# Patient Record
Sex: Female | Born: 1960 | Race: White | Hispanic: No | Marital: Married | Smoking: Current every day smoker
Health system: Southern US, Community
[De-identification: ages and names within clinical notes are randomized; demographics above are authoritative.]

## PROBLEM LIST (undated history)

## (undated) DIAGNOSIS — M1990 Unspecified osteoarthritis, unspecified site: Secondary | ICD-10-CM

## (undated) DIAGNOSIS — F329 Major depressive disorder, single episode, unspecified: Secondary | ICD-10-CM

## (undated) DIAGNOSIS — E039 Hypothyroidism, unspecified: Secondary | ICD-10-CM

## (undated) DIAGNOSIS — F419 Anxiety disorder, unspecified: Secondary | ICD-10-CM

## (undated) DIAGNOSIS — H8109 Meniere's disease, unspecified ear: Secondary | ICD-10-CM

## (undated) DIAGNOSIS — R51 Headache: Secondary | ICD-10-CM

## (undated) DIAGNOSIS — F32A Depression, unspecified: Secondary | ICD-10-CM

## (undated) DIAGNOSIS — R519 Headache, unspecified: Secondary | ICD-10-CM

## (undated) DIAGNOSIS — J449 Chronic obstructive pulmonary disease, unspecified: Secondary | ICD-10-CM

## (undated) HISTORY — PX: ABDOMINAL HYSTERECTOMY: SHX81

## (undated) HISTORY — PX: SHOULDER ARTHROSCOPY: SHX128

## (undated) HISTORY — PX: FOOT SURGERY: SHX648

---

## 2009-03-27 HISTORY — PX: SHOULDER SURGERY: SHX246

## 2016-09-13 ENCOUNTER — Other Ambulatory Visit: Payer: Self-pay | Admitting: Neurological Surgery

## 2016-09-19 ENCOUNTER — Encounter (HOSPITAL_COMMUNITY)
Admission: RE | Admit: 2016-09-19 | Discharge: 2016-09-19 | Disposition: A | Discharge status: Home / Self Care | Payer: Worker's Compensation | Source: Ambulatory Visit | Attending: Neurological Surgery | Admitting: Neurological Surgery

## 2016-09-19 ENCOUNTER — Other Ambulatory Visit (HOSPITAL_COMMUNITY): Payer: Self-pay

## 2016-09-19 ENCOUNTER — Ambulatory Visit (HOSPITAL_COMMUNITY)
Admission: RE | Admit: 2016-09-19 | Discharge: 2016-09-19 | Disposition: A | Discharge status: Home / Self Care | Payer: Worker's Compensation | Source: Ambulatory Visit | Attending: Neurological Surgery | Admitting: Neurological Surgery

## 2016-09-19 ENCOUNTER — Encounter (HOSPITAL_COMMUNITY): Payer: Self-pay

## 2016-09-19 ENCOUNTER — Inpatient Hospital Stay (HOSPITAL_COMMUNITY): Admission: RE | Admit: 2016-09-19 | Payer: Self-pay | Source: Ambulatory Visit

## 2016-09-19 DIAGNOSIS — M4802 Spinal stenosis, cervical region: Secondary | ICD-10-CM | POA: Diagnosis not present

## 2016-09-19 DIAGNOSIS — Z01810 Encounter for preprocedural cardiovascular examination: Secondary | ICD-10-CM | POA: Diagnosis not present

## 2016-09-19 DIAGNOSIS — Z01812 Encounter for preprocedural laboratory examination: Secondary | ICD-10-CM | POA: Insufficient documentation

## 2016-09-19 HISTORY — DX: Headache, unspecified: R51.9

## 2016-09-19 HISTORY — DX: Unspecified osteoarthritis, unspecified site: M19.90

## 2016-09-19 HISTORY — DX: Chronic obstructive pulmonary disease, unspecified: J44.9

## 2016-09-19 HISTORY — DX: Meniere's disease, unspecified ear: H81.09

## 2016-09-19 HISTORY — DX: Depression, unspecified: F32.A

## 2016-09-19 HISTORY — DX: Anxiety disorder, unspecified: F41.9

## 2016-09-19 HISTORY — DX: Hypothyroidism, unspecified: E03.9

## 2016-09-19 HISTORY — DX: Headache: R51

## 2016-09-19 HISTORY — DX: Major depressive disorder, single episode, unspecified: F32.9

## 2016-09-19 LAB — CBC WITH DIFFERENTIAL/PLATELET
Basophils Absolute: 0 10*3/uL (ref 0.0–0.1)
Basophils Relative: 0 %
EOS ABS: 0.2 10*3/uL (ref 0.0–0.7)
EOS PCT: 2 %
HCT: 44.8 % (ref 36.0–46.0)
Hemoglobin: 14.9 g/dL (ref 12.0–15.0)
LYMPHS PCT: 37 %
Lymphs Abs: 2.9 10*3/uL (ref 0.7–4.0)
MCH: 30 pg (ref 26.0–34.0)
MCHC: 33.3 g/dL (ref 30.0–36.0)
MCV: 90.3 fL (ref 78.0–100.0)
MONO ABS: 0.4 10*3/uL (ref 0.1–1.0)
Monocytes Relative: 5 %
Neutro Abs: 4.3 10*3/uL (ref 1.7–7.7)
Neutrophils Relative %: 56 %
PLATELETS: 261 10*3/uL (ref 150–400)
RBC: 4.96 MIL/uL (ref 3.87–5.11)
RDW: 13.8 % (ref 11.5–15.5)
WBC: 7.8 10*3/uL (ref 4.0–10.5)

## 2016-09-19 LAB — BASIC METABOLIC PANEL
Anion gap: 9 (ref 5–15)
CALCIUM: 9.5 mg/dL (ref 8.9–10.3)
CO2: 31 mmol/L (ref 22–32)
CREATININE: 0.9 mg/dL (ref 0.44–1.00)
Chloride: 99 mmol/L — ABNORMAL LOW (ref 101–111)
GFR calc Af Amer: >60 mL/min (ref >60)
GLUCOSE: 101 mg/dL — AB (ref 65–99)
POTASSIUM: 3.4 mmol/L — AB (ref 3.5–5.1)
SODIUM: 139 mmol/L (ref 135–145)

## 2016-09-19 LAB — SURGICAL PCR SCREEN
MRSA, PCR: NEGATIVE
Staphylococcus aureus: NEGATIVE

## 2016-09-19 LAB — PROTIME-INR
INR: 0.95
PROTHROMBIN TIME: 12.6 s (ref 11.4–15.2)

## 2016-09-19 MED ORDER — CHLORHEXIDINE GLUCONATE CLOTH 2 % EX PADS: 6.0000 | MEDICATED_PAD | Freq: Once | CUTANEOUS | Status: DC

## 2016-09-19 NOTE — Pre-Procedure Instructions (Signed)
Theresa SpurlingCheryl Michael [place]  09/19/2016      [facility] [address] - [place], [place] - [address] [place] [id number] Phone: [phone] Fax: [phone]   Your procedure is scheduled on September 22, 2016. [facility] at 1100 A.M. Call this number if you have problems the morning of [phone]   Remember:  Do not eat food or drink liquids after midnight September 21, 2016.  Take these medicines the morning of surgery with A SIP OF WATER acetominophen-codeine (tylenol #3) if needed, cyclobenzaprine (flexeril)-if needed, diazepam (valium)-if needed, duloxetine (cymbalta), fluticasone (flonase), gabapentin (neurontin), levothyroxine (synthroid), loratadine (claritin).  7 days prior to surgery STOP taking any Aspirin, Aleve, Naproxen, Ibuprofen, Motrin, Advil, Goody's, BC's, all herbal medications, fish oil, and all vitamins   Do not wear jewelry, make-up or nail polish.  Do not wear lotions, powders, or perfumes, or deoderant.  Do not shave 48 hours prior to surgery.    Do not bring valuables to the hospital.  [facility] is not responsible for any belongings or valuables.  Contacts, dentures or bridgework may not be worn into surgery.  Leave your suitcase in the car.  After surgery it may be brought to your room.  For patients admitted to the hospital, discharge time will be determined by your treatment team.  Patients discharged the day of surgery will not be allowed to drive home.    Special instructions:   [place]- Preparing For Surgery  Before surgery, you can play an important role. Because skin is not sterile, your skin needs to be as free of germs as possible. You can reduce the number of germs on your skin by washing with CHG (chlorahexidine gluconate) Soap before surgery.  CHG is an antiseptic cleaner which kills germs and bonds with the skin to continue killing germs even after  washing.  Please do not use if you have an allergy to CHG or antibacterial soaps. If your skin becomes reddened/irritated stop using the CHG.  Do not shave (including legs and underarms) for at least 48 hours prior to first CHG shower. It is OK to shave your face.  Please follow these instructions carefully.   1. Shower the NIGHT BEFORE SURGERY and the MORNING OF SURGERY with CHG.   2. If you chose to wash your hair, wash your hair first as usual with your normal shampoo.  3. After you shampoo, rinse your hair and body thoroughly to remove the shampoo.  4. Use CHG as you would any other liquid soap. You can apply CHG directly to the skin and wash gently with a scrungie or a clean washcloth.   5. Apply the CHG Soap to your body ONLY FROM THE NECK DOWN.  Do not use on open wounds or open sores. Avoid contact with your eyes, ears, mouth and genitals (private parts). Wash genitals (private parts) with your normal soap.  6. Wash thoroughly, paying special attention to the area where your surgery will be performed.  7. Thoroughly rinse your body with warm water from the neck down.  8. DO NOT shower/wash with your normal soap after using and rinsing off the CHG Soap.  9. Pat yourself dry with a CLEAN TOWEL.   10. Wear CLEAN PAJAMAS   11. Place CLEAN SHEETS on your bed the night of your first shower and DO NOT SLEEP WITH PETS.    Day of Surgery: Do not apply any  deodorants/lotions. Please wear clean clothes to the hospital/surgery center.     Please read over the following fact sheets that you were given. Pain Booklet, Coughing and Deep Breathing, MRSA Information and Surgical Site Infection Prevention

## 2016-09-19 NOTE — Progress Notes (Addendum)
ZOX:[id number]:Patient, No Pcp Per  Cardiologist: pt denies  EKG: pt denies past year  Stress test: pt denies  ECHO: pt denies  Cardiac Cath: pt denies  Chest x-ray:pt denies past year

## 2016-09-20 ENCOUNTER — Encounter (HOSPITAL_COMMUNITY): Payer: Self-pay | Admitting: Emergency Medicine

## 2016-09-22 ENCOUNTER — Encounter (HOSPITAL_COMMUNITY): Admission: RE | Payer: Self-pay | Source: Ambulatory Visit

## 2016-09-22 ENCOUNTER — Inpatient Hospital Stay (HOSPITAL_COMMUNITY)
Admission: RE | Admit: 2016-09-22 | Payer: Managed Care, Other (non HMO) | Source: Ambulatory Visit | Admitting: Neurological Surgery

## 2016-09-22 SURGERY — ANTERIOR CERVICAL DECOMPRESSION/DISCECTOMY FUSION 4 LEVELS
Anesthesia: General

## 2016-10-25 ENCOUNTER — Other Ambulatory Visit: Payer: Self-pay | Admitting: Neurological Surgery

## 2016-11-20 ENCOUNTER — Other Ambulatory Visit: Payer: Self-pay | Admitting: Neurological Surgery

## 2016-11-21 NOTE — Pre-Procedure Instructions (Signed)
Theresa [place] [facility]  11/21/2016      [facility] - [place] - [address] [place] [address] Phone: [phone] Fax: [phone]  [facility] - [address] [place], [place] - [address] AT [facility] [address] [place] [address] Phone: [phone] Fax: [phone]    Your procedure is scheduled on  Thursday 11/30/16  Report to [facility] Admitting at 530 A.M.  Call this number if you have problems the morning of surgery:  [phone]   Remember:  Do not eat food or drink liquids after midnight.  Take these medicines the morning of surgery with A SIP OF WATER   DIAZEPAM (VALIUM), DULOXETINE (CYMBALTA), GABAPENTIN, LEVOTHYROXINE, POTASSIUM  7 days prior to surgery STOP taking any Aspirin, Aleve, Naproxen, Ibuprofen, Motrin, Advil, Goody's, BC's, all herbal medications, fish oil, and all vitamins, GARLIC   Do not wear jewelry, make-up or nail polish.  Do not wear lotions, powders, or perfumes, or deoderant.  Do not shave 48 hours prior to surgery.  Men may shave face and neck.  Do not bring valuables to the hospital.  [facility] is not responsible for any belongings or valuables.  Contacts, dentures or bridgework may not be worn into surgery.  Leave your suitcase in the car.  After surgery it may be brought to your room.  For patients admitted to the hospital, discharge time will be determined by your treatment team.  Patients discharged the day of surgery will not be allowed to drive home.   Name and phone number of your driver:    Special instructions:  [place] - Preparing for Surgery  Before surgery, you can play an important role.  Because skin is not sterile, your skin needs to be as free of germs as possible.  You can reduce the number of germs on you skin by washing with CHG (chlorahexidine gluconate) soap  before surgery.  CHG is an antiseptic cleaner which kills germs and bonds with the skin to continue killing germs even after washing.  Please DO NOT use if you have an allergy to CHG or antibacterial soaps.  If your skin becomes reddened/irritated stop using the CHG and inform your nurse when you arrive at Short Stay.  Do not shave (including legs and underarms) for at least 48 hours prior to the first CHG shower.  You may shave your face.  Please follow these instructions carefully:   1.  Shower with CHG Soap the night before surgery and the                                morning of Surgery.  2.  If you choose to wash your hair, wash your hair first as usual with your       normal shampoo.  3.  After you shampoo, rinse your hair and body thoroughly to remove the                      Shampoo.  4.  Use CHG as you would any other liquid soap.  You can apply chg directly       to the skin and wash gently with scrungie or a clean washcloth.  5.  Apply the CHG Soap to your body ONLY FROM THE NECK  DOWN.        Do not use on open wounds or open sores.  Avoid contact with your eyes,       ears, mouth and genitals (private parts).  Wash genitals (private parts)       with your normal soap.  6.  Wash thoroughly, paying special attention to the area where your surgery        will be performed.  7.  Thoroughly rinse your body with warm water from the neck down.  8.  DO NOT shower/wash with your normal soap after using and rinsing off       the CHG Soap.  9.  Pat yourself dry with a clean towel.            10.  Wear clean pajamas.            11.  Place clean sheets on your bed the night of your first shower and do not        sleep with pets.  Day of Surgery  Do not apply any lotions/deoderants the morning of surgery.  Please wear clean clothes to the hospital/surgery center.    Please read over the following fact sheets that you were given. Pain Booklet, MRSA Information and Surgical Site Infection  Prevention

## 2016-11-22 ENCOUNTER — Encounter (HOSPITAL_COMMUNITY)
Admission: RE | Admit: 2016-11-22 | Discharge: 2016-11-22 | Disposition: A | Discharge status: Home / Self Care | Payer: Worker's Compensation | Source: Ambulatory Visit | Attending: Neurological Surgery | Admitting: Neurological Surgery

## 2016-11-22 ENCOUNTER — Encounter (HOSPITAL_COMMUNITY): Payer: Self-pay

## 2016-11-22 DIAGNOSIS — Z01812 Encounter for preprocedural laboratory examination: Secondary | ICD-10-CM | POA: Insufficient documentation

## 2016-11-22 LAB — CBC WITH DIFFERENTIAL/PLATELET
Basophils Absolute: 0 10*3/uL (ref 0.0–0.1)
Basophils Relative: 0 %
EOS PCT: 3 %
Eosinophils Absolute: 0.2 10*3/uL (ref 0.0–0.7)
HEMATOCRIT: 44.4 % (ref 36.0–46.0)
Hemoglobin: 15.1 g/dL — ABNORMAL HIGH (ref 12.0–15.0)
LYMPHS ABS: 2.2 10*3/uL (ref 0.7–4.0)
Lymphocytes Relative: 31 %
MCH: 30.4 pg (ref 26.0–34.0)
MCHC: 34 g/dL (ref 30.0–36.0)
MCV: 89.5 fL (ref 78.0–100.0)
MONO ABS: 0.5 10*3/uL (ref 0.1–1.0)
Monocytes Relative: 6 %
Neutro Abs: 4.3 10*3/uL (ref 1.7–7.7)
Neutrophils Relative %: 60 %
PLATELETS: 247 10*3/uL (ref 150–400)
RBC: 4.96 MIL/uL (ref 3.87–5.11)
RDW: 13.4 % (ref 11.5–15.5)
WBC: 7.2 10*3/uL (ref 4.0–10.5)

## 2016-11-22 LAB — BASIC METABOLIC PANEL
Anion gap: 8 (ref 5–15)
BUN: 7 mg/dL (ref 6–20)
CHLORIDE: 100 mmol/L — AB (ref 101–111)
CO2: 32 mmol/L (ref 22–32)
Calcium: 9.4 mg/dL (ref 8.9–10.3)
Creatinine, Ser: 0.91 mg/dL (ref 0.44–1.00)
GFR calc Af Amer: >60 mL/min (ref >60)
GFR calc non Af Amer: >60 mL/min (ref >60)
GLUCOSE: 86 mg/dL (ref 65–99)
Potassium: 3.8 mmol/L (ref 3.5–5.1)
Sodium: 140 mmol/L (ref 135–145)

## 2016-11-22 LAB — SURGICAL PCR SCREEN
MRSA, PCR: NEGATIVE
STAPHYLOCOCCUS AUREUS: NEGATIVE

## 2016-11-22 LAB — PROTIME-INR
INR: 0.86
Prothrombin Time: 11.6 seconds (ref 11.4–15.2)

## 2016-11-29 MED ORDER — CEFAZOLIN SODIUM-DEXTROSE 2-4 GM/100ML-% IV SOLN
2.0000 g | INTRAVENOUS | Status: AC
  Administered 2016-11-30: 2 g via INTRAVENOUS
  Filled 2016-11-29: qty 100

## 2016-11-29 MED ORDER — DEXAMETHASONE SODIUM PHOSPHATE 10 MG/ML IJ SOLN
10.0000 mg | INTRAMUSCULAR | Status: AC
  Administered 2016-11-30: 10 mg via INTRAVENOUS
  Filled 2016-11-29: qty 1

## 2016-11-30 ENCOUNTER — Inpatient Hospital Stay (HOSPITAL_COMMUNITY): Payer: Worker's Compensation | Admitting: Certified Registered Nurse Anesthetist

## 2016-11-30 ENCOUNTER — Inpatient Hospital Stay (HOSPITAL_COMMUNITY)
Admission: RE | Disposition: A | Discharge status: Home / Self Care | Payer: Self-pay | Source: Ambulatory Visit | Attending: Neurological Surgery

## 2016-11-30 ENCOUNTER — Inpatient Hospital Stay (HOSPITAL_COMMUNITY)
Admission: RE | Admit: 2016-11-30 | Discharge: 2016-12-02 | DRG: 473 | Disposition: A | Discharge status: Home / Self Care | Payer: Worker's Compensation | Source: Ambulatory Visit | Attending: Neurological Surgery | Admitting: Neurological Surgery

## 2016-11-30 ENCOUNTER — Inpatient Hospital Stay (HOSPITAL_COMMUNITY): Payer: Worker's Compensation

## 2016-11-30 ENCOUNTER — Encounter (HOSPITAL_COMMUNITY): Payer: Self-pay

## 2016-11-30 DIAGNOSIS — Z419 Encounter for procedure for purposes other than remedying health state, unspecified: Secondary | ICD-10-CM

## 2016-11-30 DIAGNOSIS — M4802 Spinal stenosis, cervical region: Secondary | ICD-10-CM | POA: Diagnosis present

## 2016-11-30 DIAGNOSIS — E039 Hypothyroidism, unspecified: Secondary | ICD-10-CM | POA: Diagnosis present

## 2016-11-30 DIAGNOSIS — M5021 Other cervical disc displacement,  high cervical region: Principal | ICD-10-CM | POA: Diagnosis present

## 2016-11-30 DIAGNOSIS — M1990 Unspecified osteoarthritis, unspecified site: Secondary | ICD-10-CM | POA: Diagnosis present

## 2016-11-30 DIAGNOSIS — Z881 Allergy status to other antibiotic agents status: Secondary | ICD-10-CM

## 2016-11-30 DIAGNOSIS — Z7951 Long term (current) use of inhaled steroids: Secondary | ICD-10-CM

## 2016-11-30 DIAGNOSIS — M4322 Fusion of spine, cervical region: Secondary | ICD-10-CM | POA: Diagnosis present

## 2016-11-30 DIAGNOSIS — F17200 Nicotine dependence, unspecified, uncomplicated: Secondary | ICD-10-CM | POA: Diagnosis present

## 2016-11-30 DIAGNOSIS — H8109 Meniere's disease, unspecified ear: Secondary | ICD-10-CM | POA: Diagnosis present

## 2016-11-30 DIAGNOSIS — J449 Chronic obstructive pulmonary disease, unspecified: Secondary | ICD-10-CM | POA: Diagnosis present

## 2016-11-30 DIAGNOSIS — Z888 Allergy status to other drugs, medicaments and biological substances status: Secondary | ICD-10-CM

## 2016-11-30 DIAGNOSIS — Z886 Allergy status to analgesic agent status: Secondary | ICD-10-CM

## 2016-11-30 DIAGNOSIS — Z885 Allergy status to narcotic agent status: Secondary | ICD-10-CM

## 2016-11-30 HISTORY — PX: ANTERIOR CERVICAL DECOMP/DISCECTOMY FUSION: SHX1161

## 2016-11-30 SURGERY — ANTERIOR CERVICAL DECOMPRESSION/DISCECTOMY FUSION 3 LEVELS
Anesthesia: General

## 2016-11-30 MED ORDER — NICOTINE 21 MG/24HR TD PT24
21.0000 mg | MEDICATED_PATCH | Freq: Every day | TRANSDERMAL | Status: DC
  Administered 2016-11-30 – 2016-12-01 (×2): 21 mg via TRANSDERMAL
  Filled 2016-11-30 (×2): qty 1

## 2016-11-30 MED ORDER — DULOXETINE HCL 30 MG PO CPEP
60.0000 mg | ORAL_CAPSULE | Freq: Two times a day (BID) | ORAL | Status: DC
  Administered 2016-11-30 – 2016-12-01 (×3): 60 mg via ORAL
  Filled 2016-11-30 (×5): qty 2

## 2016-11-30 MED ORDER — MENTHOL 3 MG MT LOZG: 1.0000 | LOZENGE | OROMUCOSAL | Status: DC | PRN

## 2016-11-30 MED ORDER — SODIUM CHLORIDE 0.9 % IR SOLN
Status: DC | PRN
  Administered 2016-11-30: 08:00:00

## 2016-11-30 MED ORDER — SUGAMMADEX SODIUM 200 MG/2ML IV SOLN
INTRAVENOUS | Status: DC | PRN
  Administered 2016-11-30: 150 mg via INTRAVENOUS

## 2016-11-30 MED ORDER — BUPIVACAINE HCL (PF) 0.5 % IJ SOLN
INTRAMUSCULAR | Status: AC
Start: 2016-11-30 — End: 2016-11-30
  Filled 2016-11-30: qty 30

## 2016-11-30 MED ORDER — LACTATED RINGERS IV SOLN
INTRAVENOUS | Status: DC | PRN
  Administered 2016-11-30 (×2): via INTRAVENOUS

## 2016-11-30 MED ORDER — HYDROMORPHONE HCL 1 MG/ML IJ SOLN: 0.2500 mg | INTRAMUSCULAR | Status: DC | PRN

## 2016-11-30 MED ORDER — GABAPENTIN 100 MG PO CAPS
100.0000 mg | ORAL_CAPSULE | Freq: Two times a day (BID) | ORAL | Status: DC
  Administered 2016-11-30 – 2016-12-01 (×3): 100 mg via ORAL
  Filled 2016-11-30 (×4): qty 1

## 2016-11-30 MED ORDER — 0.9 % SODIUM CHLORIDE (POUR BTL) OPTIME
TOPICAL | Status: DC | PRN
  Administered 2016-11-30: 1000 mL

## 2016-11-30 MED ORDER — ROCURONIUM BROMIDE 10 MG/ML (PF) SYRINGE
PREFILLED_SYRINGE | INTRAVENOUS | Status: AC
  Filled 2016-11-30: qty 5

## 2016-11-30 MED ORDER — VERAPAMIL HCL ER 240 MG PO TBCR
240.0000 mg | EXTENDED_RELEASE_TABLET | Freq: Every day | ORAL | Status: DC
  Administered 2016-12-01: 240 mg via ORAL
  Filled 2016-11-30 (×2): qty 1

## 2016-11-30 MED ORDER — CHLORHEXIDINE GLUCONATE CLOTH 2 % EX PADS: 6.0000 | MEDICATED_PAD | Freq: Once | CUTANEOUS | Status: DC

## 2016-11-30 MED ORDER — MIDAZOLAM HCL 2 MG/2ML IJ SOLN
INTRAMUSCULAR | Status: AC
  Filled 2016-11-30: qty 2

## 2016-11-30 MED ORDER — PHENYLEPHRINE HCL 10 MG/ML IJ SOLN
INTRAVENOUS | Status: DC | PRN
  Administered 2016-11-30: 25 ug/min via INTRAVENOUS

## 2016-11-30 MED ORDER — THROMBIN 5000 UNITS EX SOLR
CUTANEOUS | Status: DC | PRN
Start: 2016-11-30 — End: 2016-11-30
  Administered 2016-11-30 (×2): 5000 [IU] via TOPICAL

## 2016-11-30 MED ORDER — LIDOCAINE 2% (20 MG/ML) 5 ML SYRINGE
INTRAMUSCULAR | Status: AC
  Filled 2016-11-30: qty 5

## 2016-11-30 MED ORDER — DEXTROSE 5 % IV SOLN
500.0000 mg | Freq: Four times a day (QID) | INTRAVENOUS | Status: DC | PRN
  Filled 2016-11-30: qty 5

## 2016-11-30 MED ORDER — POTASSIUM CHLORIDE IN NACL 20-0.9 MEQ/L-% IV SOLN: INTRAVENOUS | Status: DC

## 2016-11-30 MED ORDER — ONDANSETRON HCL 4 MG/2ML IJ SOLN: 4.0000 mg | Freq: Once | INTRAMUSCULAR | Status: DC | PRN

## 2016-11-30 MED ORDER — ONDANSETRON HCL 4 MG PO TABS: 4.0000 mg | ORAL_TABLET | Freq: Four times a day (QID) | ORAL | Status: DC | PRN

## 2016-11-30 MED ORDER — THROMBIN 5000 UNITS EX SOLR
OROMUCOSAL | Status: DC | PRN
  Administered 2016-11-30 (×2): via TOPICAL

## 2016-11-30 MED ORDER — THROMBIN 5000 UNITS EX SOLR
CUTANEOUS | Status: AC
  Filled 2016-11-30: qty 5000

## 2016-11-30 MED ORDER — ARTIFICIAL TEARS OPHTHALMIC OINT
TOPICAL_OINTMENT | OPHTHALMIC | Status: DC | PRN
  Administered 2016-11-30: 1 via OPHTHALMIC

## 2016-11-30 MED ORDER — ROCURONIUM BROMIDE 100 MG/10ML IV SOLN
INTRAVENOUS | Status: DC | PRN
  Administered 2016-11-30: 50 mg via INTRAVENOUS
  Administered 2016-11-30: 10 mg via INTRAVENOUS
  Administered 2016-11-30 (×2): 20 mg via INTRAVENOUS

## 2016-11-30 MED ORDER — ACETAMINOPHEN 650 MG RE SUPP: 650.0000 mg | RECTAL | Status: DC | PRN

## 2016-11-30 MED ORDER — ACETAMINOPHEN 325 MG PO TABS: 650.0000 mg | ORAL_TABLET | ORAL | Status: DC | PRN

## 2016-11-30 MED ORDER — CEFAZOLIN SODIUM-DEXTROSE 2-4 GM/100ML-% IV SOLN
2.0000 g | Freq: Three times a day (TID) | INTRAVENOUS | Status: AC
  Administered 2016-11-30 (×2): 2 g via INTRAVENOUS
  Filled 2016-11-30 (×2): qty 100

## 2016-11-30 MED ORDER — FENTANYL CITRATE (PF) 100 MCG/2ML IJ SOLN
INTRAMUSCULAR | Status: DC | PRN
  Administered 2016-11-30 (×4): 50 ug via INTRAVENOUS
  Administered 2016-11-30: 100 ug via INTRAVENOUS
  Administered 2016-11-30: 50 ug via INTRAVENOUS

## 2016-11-30 MED ORDER — ONDANSETRON HCL 4 MG/2ML IJ SOLN
INTRAMUSCULAR | Status: DC | PRN
  Administered 2016-11-30: 4 mg via INTRAVENOUS

## 2016-11-30 MED ORDER — SODIUM CHLORIDE 0.9% FLUSH: 3.0000 mL | INTRAVENOUS | Status: DC | PRN

## 2016-11-30 MED ORDER — ONDANSETRON HCL 4 MG/2ML IJ SOLN: 4.0000 mg | Freq: Four times a day (QID) | INTRAMUSCULAR | Status: DC | PRN

## 2016-11-30 MED ORDER — LIDOCAINE HCL (CARDIAC) 20 MG/ML IV SOLN
INTRAVENOUS | Status: DC | PRN
  Administered 2016-11-30: 100 mg via INTRAVENOUS

## 2016-11-30 MED ORDER — SENNA 8.6 MG PO TABS
1.0000 | ORAL_TABLET | Freq: Two times a day (BID) | ORAL | Status: DC
  Administered 2016-11-30 – 2016-12-01 (×4): 8.6 mg via ORAL
  Filled 2016-11-30 (×4): qty 1

## 2016-11-30 MED ORDER — CEFAZOLIN SODIUM-DEXTROSE 2-4 GM/100ML-% IV SOLN: 2.0000 g | INTRAVENOUS | Status: DC

## 2016-11-30 MED ORDER — PROPOFOL 10 MG/ML IV BOLUS
INTRAVENOUS | Status: DC | PRN
  Administered 2016-11-30: 120 mg via INTRAVENOUS

## 2016-11-30 MED ORDER — THROMBIN 20000 UNITS EX SOLR
CUTANEOUS | Status: AC
  Filled 2016-11-30: qty 20000

## 2016-11-30 MED ORDER — THROMBIN 20000 UNITS EX KIT
PACK | CUTANEOUS | Status: DC | PRN
Start: 2016-11-30 — End: 2016-11-30
  Administered 2016-11-30: 20000 [IU] via TOPICAL

## 2016-11-30 MED ORDER — MIDAZOLAM HCL 5 MG/5ML IJ SOLN
INTRAMUSCULAR | Status: DC | PRN
  Administered 2016-11-30: 2 mg via INTRAVENOUS

## 2016-11-30 MED ORDER — PHENYLEPHRINE HCL 10 MG/ML IJ SOLN
INTRAMUSCULAR | Status: DC | PRN
  Administered 2016-11-30: 80 ug via INTRAVENOUS

## 2016-11-30 MED ORDER — SODIUM CHLORIDE 0.9% FLUSH
3.0000 mL | Freq: Two times a day (BID) | INTRAVENOUS | Status: DC
  Administered 2016-11-30: 3 mL via INTRAVENOUS

## 2016-11-30 MED ORDER — PROPOFOL 10 MG/ML IV BOLUS
INTRAVENOUS | Status: AC
  Filled 2016-11-30: qty 40

## 2016-11-30 MED ORDER — PHENOL 1.4 % MT LIQD
1.0000 | OROMUCOSAL | Status: DC | PRN
  Administered 2016-12-01: 1 via OROMUCOSAL
  Filled 2016-11-30: qty 177

## 2016-11-30 MED ORDER — ARTIFICIAL TEARS OPHTHALMIC OINT
TOPICAL_OINTMENT | OPHTHALMIC | Status: AC
  Filled 2016-11-30: qty 3.5

## 2016-11-30 MED ORDER — PHENYLEPHRINE 40 MCG/ML (10ML) SYRINGE FOR IV PUSH (FOR BLOOD PRESSURE SUPPORT)
PREFILLED_SYRINGE | INTRAVENOUS | Status: AC
  Filled 2016-11-30: qty 10

## 2016-11-30 MED ORDER — HYDROCHLOROTHIAZIDE 25 MG PO TABS
50.0000 mg | ORAL_TABLET | Freq: Every day | ORAL | Status: DC
  Filled 2016-11-30 (×2): qty 2

## 2016-11-30 MED ORDER — FENTANYL CITRATE (PF) 250 MCG/5ML IJ SOLN
INTRAMUSCULAR | Status: AC
  Filled 2016-11-30: qty 5

## 2016-11-30 MED ORDER — METHOCARBAMOL 500 MG PO TABS
500.0000 mg | ORAL_TABLET | Freq: Four times a day (QID) | ORAL | Status: DC | PRN
  Administered 2016-12-01 – 2016-12-02 (×3): 500 mg via ORAL
  Filled 2016-11-30 (×3): qty 1

## 2016-11-30 MED ORDER — LEVOTHYROXINE SODIUM 75 MCG PO TABS
75.0000 ug | ORAL_TABLET | Freq: Every day | ORAL | Status: DC
  Administered 2016-12-01 – 2016-12-02 (×2): 75 ug via ORAL
  Filled 2016-11-30 (×2): qty 1

## 2016-11-30 MED ORDER — DEXAMETHASONE SODIUM PHOSPHATE 10 MG/ML IJ SOLN: 10.0000 mg | INTRAMUSCULAR | Status: DC

## 2016-11-30 MED ORDER — HYDROCODONE-ACETAMINOPHEN 7.5-325 MG PO TABS
1.0000 | ORAL_TABLET | Freq: Four times a day (QID) | ORAL | Status: DC | PRN
  Administered 2016-11-30 – 2016-12-01 (×3): 2 via ORAL
  Filled 2016-11-30 (×4): qty 2

## 2016-11-30 MED ORDER — HEMOSTATIC AGENTS (NO CHARGE) OPTIME
TOPICAL | Status: DC | PRN
  Administered 2016-11-30: 1 via TOPICAL

## 2016-11-30 MED ORDER — ONDANSETRON HCL 4 MG/2ML IJ SOLN
INTRAMUSCULAR | Status: AC
  Filled 2016-11-30: qty 2

## 2016-11-30 MED ORDER — MEPERIDINE HCL 25 MG/ML IJ SOLN: 6.2500 mg | INTRAMUSCULAR | Status: DC | PRN

## 2016-11-30 MED ORDER — SUGAMMADEX SODIUM 200 MG/2ML IV SOLN
INTRAVENOUS | Status: AC
  Filled 2016-11-30: qty 2

## 2016-11-30 MED ORDER — BUPIVACAINE HCL (PF) 0.25 % IJ SOLN
INTRAMUSCULAR | Status: DC | PRN
  Administered 2016-11-30: 10 mL

## 2016-11-30 MED ORDER — MORPHINE SULFATE (PF) 4 MG/ML IV SOLN
2.0000 mg | INTRAVENOUS | Status: DC | PRN
  Administered 2016-11-30 (×2): 2 mg via INTRAVENOUS
  Filled 2016-11-30 (×2): qty 1

## 2016-11-30 SURGICAL SUPPLY — 60 items
BAG DECANTER FOR FLEXI CONT (MISCELLANEOUS) ×3 IMPLANT
BASKET BONE COLLECTION (BASKET) ×3 IMPLANT
BENZOIN TINCTURE PRP APPL 2/3 (GAUZE/BANDAGES/DRESSINGS) ×3 IMPLANT
BIT DRILL 13 (BIT) ×4 IMPLANT
BIT DRILL 13MM (BIT) ×2
BUR MATCHSTICK NEURO 3.0 LAGG (BURR) ×3 IMPLANT
CAGE C-PTI 4X14X16 7D MED (Cage) ×3 IMPLANT
CAGE C-PTI 4X16X18 7D LG (Cage) ×6 IMPLANT
CAGE C-PTI 5X16X18 7D LG (Cage) ×3 IMPLANT
CANISTER SUCT 3000ML PPV (MISCELLANEOUS) ×3 IMPLANT
CARTRIDGE OIL MAESTRO DRILL (MISCELLANEOUS) ×1 IMPLANT
CLOSURE WOUND 1/2 X4 (GAUZE/BANDAGES/DRESSINGS) ×1
DIFFUSER DRILL AIR PNEUMATIC (MISCELLANEOUS) ×3 IMPLANT
DRAIN SNY WOU 7FLT (WOUND CARE) ×3 IMPLANT
DRAPE C-ARM 42X72 X-RAY (DRAPES) ×6 IMPLANT
DRAPE LAPAROTOMY 100X72 PEDS (DRAPES) ×3 IMPLANT
DRAPE MICROSCOPE LEICA (MISCELLANEOUS) ×3 IMPLANT
DRAPE POUCH INSTRU U-SHP 10X18 (DRAPES) ×3 IMPLANT
DRSG OPSITE 4X5.5 SM (GAUZE/BANDAGES/DRESSINGS) ×3 IMPLANT
DRSG OPSITE POSTOP 4X6 (GAUZE/BANDAGES/DRESSINGS) ×3 IMPLANT
DRSG TELFA 3X8 NADH (GAUZE/BANDAGES/DRESSINGS) ×3 IMPLANT
DURAPREP 6ML APPLICATOR 50/CS (WOUND CARE) ×3 IMPLANT
ELECT COATED BLADE 2.86 ST (ELECTRODE) ×3 IMPLANT
ELECT REM PT RETURN 9FT ADLT (ELECTROSURGICAL) ×3
ELECTRODE REM PT RTRN 9FT ADLT (ELECTROSURGICAL) ×1 IMPLANT
EVACUATOR SILICONE 100CC (DRAIN) ×3 IMPLANT
GAUZE SPONGE 4X4 16PLY XRAY LF (GAUZE/BANDAGES/DRESSINGS) ×3 IMPLANT
GLOVE BIO SURGEON STRL SZ7 (GLOVE) ×3 IMPLANT
GLOVE BIO SURGEON STRL SZ8 (GLOVE) ×3 IMPLANT
GLOVE BIOGEL PI IND STRL 7.0 (GLOVE) ×1 IMPLANT
GLOVE BIOGEL PI INDICATOR 7.0 (GLOVE) ×2
GOWN STRL REUS W/ TWL LRG LVL3 (GOWN DISPOSABLE) IMPLANT
GOWN STRL REUS W/ TWL XL LVL3 (GOWN DISPOSABLE) ×1 IMPLANT
GOWN STRL REUS W/TWL 2XL LVL3 (GOWN DISPOSABLE) IMPLANT
GOWN STRL REUS W/TWL LRG LVL3 (GOWN DISPOSABLE)
GOWN STRL REUS W/TWL XL LVL3 (GOWN DISPOSABLE) ×2
HALTER HD/CHIN CERV TRACTION D (MISCELLANEOUS) IMPLANT
HEMOSTAT POWDER KIT SURGIFOAM (HEMOSTASIS) ×6 IMPLANT
KIT BASIN OR (CUSTOM PROCEDURE TRAY) ×3 IMPLANT
KIT ROOM TURNOVER OR (KITS) ×3 IMPLANT
NEEDLE HYPO 25X1 1.5 SAFETY (NEEDLE) ×3 IMPLANT
NEEDLE SPNL 20GX3.5 QUINCKE YW (NEEDLE) ×3 IMPLANT
NS IRRIG 1000ML POUR BTL (IV SOLUTION) ×3 IMPLANT
OIL CARTRIDGE MAESTRO DRILL (MISCELLANEOUS) ×3
PACK LAMINECTOMY NEURO (CUSTOM PROCEDURE TRAY) ×3 IMPLANT
PAD ARMBOARD 7.5X6 YLW CONV (MISCELLANEOUS) ×3 IMPLANT
PIN DISTRACTION 14MM (PIN) ×6 IMPLANT
PLATE 4 75XNS SPNE CVD ANT T (Plate) ×1 IMPLANT
PLATE 4 ATLANTIS TRANS (Plate) ×2 IMPLANT
RUBBERBAND STERILE (MISCELLANEOUS) ×6 IMPLANT
SCREW 4.0X13 (Screw) ×4 IMPLANT
SCREW BN 13X4XSLF DRL FXANG (Screw) ×2 IMPLANT
SCREW ST FIX 4 ATL 3120213 (Screw) ×24 IMPLANT
SPONGE INTESTINAL PEANUT (DISPOSABLE) ×3 IMPLANT
SPONGE SURGIFOAM ABS GEL 100 (HEMOSTASIS) ×3 IMPLANT
STRIP CLOSURE SKIN 1/2X4 (GAUZE/BANDAGES/DRESSINGS) ×2 IMPLANT
SUT VIC AB 3-0 SH 8-18 (SUTURE) ×9 IMPLANT
TOWEL GREEN STERILE (TOWEL DISPOSABLE) ×3 IMPLANT
TOWEL GREEN STERILE FF (TOWEL DISPOSABLE) ×3 IMPLANT
WATER STERILE IRR 1000ML POUR (IV SOLUTION) ×3 IMPLANT

## 2016-11-30 NOTE — Progress Notes (Signed)
Orthopedic Tech Progress Note Patient Details:  Theresa BlowCheryl Michael Marti Oct 27, 1960 [id number]  Ortho Devices Type of Ortho Device: Soft collar Ortho Device/Splint Location: neck Ortho Device/Splint Interventions: Application   Janiaya Ryser 11/30/2016, 3:35 PM

## 2016-11-30 NOTE — Transfer of Care (Signed)
Immediate Anesthesia Transfer of Care Note  Patient: Theresa [place]  Procedure(s) Performed: Procedure(s): ACDF  C3-C4 C4-C5 C5-C6 C6-C7 (N/A)  Patient Location: PACU  Anesthesia Type:General  Level of Consciousness: drowsy and patient cooperative  Airway & Oxygen Therapy: Patient Spontanous Breathing and Patient connected to nasal cannula oxygen  Post-op Assessment: Report given to RN and Post -op Vital signs reviewed and stable  Post vital signs: Reviewed and stable  Last Vitals:  Vitals:   11/30/16 0548 11/30/16 1152  BP: 125/68 129/78  Pulse: 68 93  Resp: 20 14  Temp: 36.8 C (!) 36.3 C  SpO2: 97% 92%    Last Pain:  Vitals:   11/30/16 0648  TempSrc:   PainSc: 7       Patients Stated Pain Goal: 3 (11/30/16 [id number])  Complications: No apparent anesthesia complications

## 2016-11-30 NOTE — Op Note (Signed)
11/30/2016  11:39 AM  PATIENT:  Theresa [place]  56 y.o. female  PRE-OPERATIVE DIAGNOSIS:  Cervical spinal stenosis C3-4 C4-5 C5-6 and C6-7 with right arm pain, weakness in the right shoulder  POST-OPERATIVE DIAGNOSIS:  same  PROCEDURE:  1. Decompressive anterior cervical discectomy C3-4 C4-5 and C5-6 C6-7, 2. Anterior cervical arthrodesis C3-4 C4-5 and C5-6 C6-7 utilizing a porous titanium interbody cages packed with locally harvested morcellized autologous bone graft, 3. Anterior cervical plating C3-C7 inclusive utilizing a Atlantis translational plate  SURGEON:  Marikay Alar, MD  ASSISTANTS: Dr. Conchita Paris  ANESTHESIA:   General  EBL: 50 ml  Total I/O In: 1000 [I.V.:1000] Out: 50 [Blood:50]  BLOOD ADMINISTERED: none  DRAINS: none  SPECIMEN:  none  INDICATION FOR PROCEDURE: This patient presented with severe weakness in the right deltoid and right arm pain. Imaging showed severe spinal stenosis at C3-4 C4-5 C5-6 and C6-7 with cord compression at C4-5 C5-6 and C6-7. The patient tried conservative measures without relief. Pain was debilitating. Recommended ACDF with plating. Patient understood the risks, benefits, and alternatives and potential outcomes and wished to proceed.  PROCEDURE DETAILS: Patient was brought to the operating room placed under general endotracheal anesthesia. Patient was placed in the supine position on the operating room table. The neck was prepped with Duraprep and draped in a sterile fashion.   Three cc of local anesthesia was injected and a transverse incision was made on the right side of the neck.  Dissection was carried down thru the subcutaneous tissue and the platysma was  elevated, opened, and undermined with Metzenbaum scissors.  Dissection was then carried out thru an avascular plane leaving the sternocleidomastoid carotid artery and jugular vein laterally and the trachea and esophagus medially. The ventral aspect of the vertebral column was  identified and a localizing x-ray was taken. The C4-5 level was identified. The longus colli muscles were then elevated and the retractor was placed to expose C3-4 C4-5 C5-6 and C6-7. The annulus was incised at each level and the disc space entered. Discectomy was performed with micro-curettes and pituitary rongeurs. I then used the high-speed drill to drill the endplates down to the level of the posterior longitudinal ligament. The drill shavings were saved in a mucous trap for later arthrodesis. The operating microscope was draped and brought into the field provided additional magnification, illumination and visualization. Discectomy was continued posteriorly thru the disc space. Posterior longitudinal ligament was opened with a nerve hook, and then removed along with disc herniation and osteophytes, decompressing the spinal canal and thecal sac. We then continued to remove osteophytic overgrowth and disc material decompressing the neural foramina and exiting nerve roots bilaterally. Particular attention was placed to the right-hand side because of the right arm pain and weakness in the right deltoid. The scope was angled up and down to help decompress and undercut the vertebral bodies. Once the decompression was completed we could pass a nerve hook circumferentially to assure adequate decompression in the midline and in the neural foramina. So by both visualization and palpation we felt we had an adequate decompression of the neural elements. We then measured the height of the intravertebral disc space and selected a 6 millimeter porous titanium interbody cage packed with autograft. It was then gently positioned in the intravertebral disc space(s) and countersunk. I then used a 75 mm Atlantis translational plate and placed fixed angle screws into the vertebral bodies of each level from C3-C7 and locked them into position. The wound was irrigated with  bacitracin solution, checked for hemostasis which was  established and confirmed. Once meticulous hemostasis was achieved, a 7 flat JP drain was placed and we then proceeded with closure. The platysma was closed with interrupted 3-0 undyed Vicryl suture, the subcuticular layer was closed with interrupted 3-0 undyed Vicryl suture. The skin edges were approximated with steristrips. The drapes were removed. A sterile dressing was applied. The patient was then awakened from general anesthesia and transferred to the recovery room in stable condition. At the end of the procedure all sponge, needle and instrument counts were correct.   PLAN OF CARE: Admit for overnight observation  PATIENT DISPOSITION:  PACU - hemodynamically stable.   Delay start of Pharmacological VTE agent (>24hrs) due to surgical blood loss or risk of bleeding:  yes

## 2016-11-30 NOTE — H&P (Signed)
Subjective:   Patient is a 56 y.o. female admitted for cervical stenosis. The patient first presented to me with complaints of neck pain, shooting pains in the arm(s), numbness of the arm(s) and loss of strength of the arm(s). Onset of symptoms was several years ago. The pain is described as sharp, burning and cramping and occurs all day. The pain is rated severe, and is located in the neck and radiates to the RUE. The symptoms have been progressive. Symptoms are exacerbated by extending head backwards, and are relieved by none.  Previous work up includes MRI of cervical spine, results: spinal stenosis.  Past Medical History:  Diagnosis Date  . Anxiety   . Arthritis   . COPD (chronic obstructive pulmonary disease) (HCC)   . Depression   . Headache   . Hypothyroidism   . Meniere's disease     Past Surgical History:  Procedure Laterality Date  . ABDOMINAL HYSTERECTOMY    . CESAREAN SECTION     1984, 1986  . FOOT SURGERY  2015, 2017   bone spurs  . SHOULDER ARTHROSCOPY     RT 01/2016   . SHOULDER SURGERY Bilateral 2011   bone spurs    Allergies  Allergen Reactions  . Celebrex [Celecoxib] Other (See Comments)    Abdominal pain    . Ciprofloxacin Other (See Comments)    Depression - patient unaware of this.  . Guaifenesin Palpitations    Decreased hearing   . Aspirin Rash    Severe rash  . Erythromycin Tinitus  . Nsaids Rash    Severe rash  . Oxycodone Itching and Rash    Social History  Substance Use Topics  . Smoking status: Current Every Day Smoker    Packs/day: 0.50    Years: 15.00  . Smokeless tobacco: Never Used  . Alcohol use No    History reviewed. No pertinent family history. Prior to Admission medications   Medication Sig Start Date End Date Taking? Authorizing Provider  Ascorbic Acid (VITAMIN C) 1000 MG tablet Take 2,000 mg by mouth daily.   Yes [provider]  Calcium-Magnesium-Zinc (CAL-MAG-ZINC PO) Take 2 tablets by mouth at bedtime.    Yes  [provider]  cholecalciferol (VITAMIN D) 1000 units tablet Take 1,000 Units by mouth 2 (two) times daily.    Yes [provider]  diazepam (VALIUM) 5 MG tablet Take 1 tablet by mouth every 8 hours as needed for dizziness or pain 08/14/16  Yes [provider]  DULoxetine (CYMBALTA) 60 MG capsule Take 60 mg by mouth 2 (two) times daily. 09/10/16  Yes [provider]  fluticasone (FLONASE) 50 MCG/ACT nasal spray Place 2 sprays into both nostrils daily.   Yes [provider]  gabapentin (NEURONTIN) 100 MG capsule Take 100 mg by mouth 2 (two) times daily. 09/13/16  Yes [provider]  GARLIC OIL PO Take 1 capsule by mouth 2 (two) times daily.   Yes [provider]  hydrochlorothiazide (HYDRODIURIL) 50 MG tablet Take 50 mg by mouth daily.  08/22/16  Yes [provider]  levothyroxine (SYNTHROID, LEVOTHROID) 75 MCG tablet Take 75 mcg by mouth daily before breakfast. 09/26/16  Yes [provider]  loratadine (CLARITIN) 10 MG tablet Take 10 mg by mouth every evening.   Yes [provider]  potassium chloride SA (K-DUR,KLOR-CON) 20 MEQ tablet Take 20 mEq by mouth daily. 08/22/16  Yes [provider]  verapamil (CALAN-SR) 240 MG CR tablet Take 240 mg by mouth  at bedtime.  08/29/16  Yes [provider]  vitamin A (VITAMIN A) 10000 UNIT capsule Take 10,000 Units by mouth at bedtime.    Yes [provider]  atorvastatin (LIPITOR) 40 MG tablet Take 40 mg by mouth daily.  09/08/16   [provider]  promethazine (PHENERGAN) 12.5 MG tablet take 1 tablet by mouth once a day as needed for nausea 08/14/16   [provider]     Review of Systems  Positive ROS: neg  All other systems have been reviewed and were otherwise negative with the exception of those mentioned in the HPI and as above.  Objective: Vital signs in last 24 hours: Temp:  [98.2 F (36.8 C)] 98.2 F (36.8 C) (09/06  0548) Pulse Rate:  [68] 68 (09/06 0548) Resp:  [20] 20 (09/06 0548) BP: (125)/(68) 125/68 (09/06 0548) SpO2:  [97 %] 97 % (09/06 0548)  General Appearance: Alert, cooperative, no distress, appears stated age Head: Normocephalic, without obvious abnormality, atraumatic Eyes: PERRL, conjunctiva/corneas clear, EOM's intact      Neck: Supple, symmetrical, trachea midline, Back: Symmetric, no curvature, ROM normal, no CVA tenderness Lungs:  respirations unlabored Heart: Regular rate and rhythm Abdomen: Soft, non-tender Extremities: Extremities normal, atraumatic, no cyanosis or edema Pulses: 2+ and symmetric all extremities Skin: Skin color, texture, turgor normal, no rashes or lesions  NEUROLOGIC:  Mental status: Alert and oriented x4, no aphasia, good attention span, fund of knowledge and memory  Motor Exam - weakness R deltoid and bicep Sensory Exam - grossly normal Reflexes: 1+ Coordination - grossly normal Gait - grossly normal Balance - grossly normal Cranial Nerves: I: smell Not tested  II: visual acuity  OS: nl    OD: nl  II: visual fields Full to confrontation  II: pupils Equal, round, reactive to light  III,VII: ptosis None  III,IV,VI: extraocular muscles  Full ROM  V: mastication Normal  V: facial light touch sensation  Normal  V,VII: corneal reflex  Present  VII: facial muscle function - upper  Normal  VII: facial muscle function - lower Normal  VIII: hearing Not tested  IX: soft palate elevation  Normal  IX,X: gag reflex Present  XI: trapezius strength  5/5  XI: sternocleidomastoid strength 5/5  XI: neck flexion strength  5/5  XII: tongue strength  Normal    Data Review Lab Results  Component Value Date   WBC 7.2 11/22/2016   HGB 15.1 (H) 11/22/2016   HCT 44.4 11/22/2016   MCV 89.5 11/22/2016   PLT 247 11/22/2016   Lab Results  Component Value Date   NA 140 11/22/2016   K 3.8 11/22/2016   CL 100 (L) 11/22/2016   CO2 32 11/22/2016   BUN 7  11/22/2016   CREATININE 0.91 11/22/2016   GLUCOSE 86 11/22/2016   Lab Results  Component Value Date   INR 0.86 11/22/2016    Assessment:   Cervical neck pain with herniated nucleus pulposus/ spondylosis/ stenosis at C3-7. Patient has failed conservative therapy. Planned surgery : ACDF C3-4 C4-5 C5-6 C6-7  Plan:   I explained the condition and procedure to the patient and answered any questions.  Patient wishes to proceed with procedure as planned. Understands risks/ benefits/ and expected or typical outcomes.  Charbel Los S 11/30/2016 7:15 AM

## 2016-11-30 NOTE — Anesthesia Preprocedure Evaluation (Signed)
Anesthesia Evaluation  Patient identified by MRN, date of birth, ID band Patient awake    Reviewed: Allergy & Precautions, NPO status , Patient's Chart, lab work & pertinent test results  Airway Mallampati: I  TM Distance: >3 FB Neck ROM: Full    Dental   Pulmonary COPD, Current Smoker,    Pulmonary exam normal        Cardiovascular Normal cardiovascular exam     Neuro/Psych Anxiety Depression    GI/Hepatic   Endo/Other    Renal/GU      Musculoskeletal   Abdominal   Peds  Hematology   Anesthesia Other Findings   Reproductive/Obstetrics                             Anesthesia Physical Anesthesia Plan  ASA: II  Anesthesia Plan: General   Post-op Pain Management:    Induction: Intravenous  PONV Risk Score and Plan: 2 and Ondansetron and Dexamethasone  Airway Management Planned: Oral ETT  Additional Equipment:   Intra-op Plan:   Post-operative Plan: Extubation in OR  Informed Consent: I have reviewed the patients History and Physical, chart, labs and discussed the procedure including the risks, benefits and alternatives for the proposed anesthesia with the patient or authorized representative who has indicated his/her understanding and acceptance.     Plan Discussed with: CRNA and Surgeon  Anesthesia Plan Comments:         Anesthesia Quick Evaluation

## 2016-11-30 NOTE — Anesthesia Procedure Notes (Signed)
Procedure Name: Intubation Date/Time: 11/30/2016 7:40 AM Performed by: Candis Shine Pre-anesthesia Checklist: Patient identified, Emergency Drugs available, Suction available and Patient being monitored Patient Re-evaluated:Patient Re-evaluated prior to induction Oxygen Delivery Method: Circle System Utilized Preoxygenation: Pre-oxygenation with 100% oxygen Induction Type: IV induction Ventilation: Mask ventilation without difficulty Laryngoscope Size: Mac and 3 Grade View: Grade I Tube type: Oral Tube size: 7.0 mm Number of attempts: 1 Airway Equipment and Method: Stylet Placement Confirmation: ETT inserted through vocal cords under direct vision,  positive ETCO2 and breath sounds checked- equal and bilateral Secured at: 22 cm Tube secured with: Tape Dental Injury: Teeth and Oropharynx as per pre-operative assessment

## 2016-11-30 NOTE — Anesthesia Postprocedure Evaluation (Signed)
Anesthesia Post Note  Patient: Theodora BlowCheryl Michael Dobkins  Procedure(s) Performed: Procedure(s) (LRB): ACDF  C3-C4 C4-C5 C5-C6 C6-C7 (N/A)     Patient location during evaluation: PACU Anesthesia Type: General Level of consciousness: awake and alert Pain management: pain level controlled Vital Signs Assessment: post-procedure vital signs reviewed and stable Respiratory status: spontaneous breathing, nonlabored ventilation, respiratory function stable and patient connected to nasal cannula oxygen Cardiovascular status: blood pressure returned to baseline and stable Postop Assessment: no signs of nausea or vomiting Anesthetic complications: no    Last Vitals:  Vitals:   11/30/16 1152 11/30/16 1205  BP: 129/78 106/64  Pulse: 93 80  Resp: 14 13  Temp: (!) 36.3 C   SpO2: 92% 98%    Last Pain:  Vitals:   11/30/16 1214  TempSrc:   PainSc: (P) 0-No pain                 Jakia Kennebrew DAVID

## 2016-12-01 DIAGNOSIS — E039 Hypothyroidism, unspecified: Secondary | ICD-10-CM | POA: Diagnosis present

## 2016-12-01 DIAGNOSIS — M1990 Unspecified osteoarthritis, unspecified site: Secondary | ICD-10-CM | POA: Diagnosis present

## 2016-12-01 DIAGNOSIS — M4802 Spinal stenosis, cervical region: Secondary | ICD-10-CM | POA: Diagnosis present

## 2016-12-01 DIAGNOSIS — J449 Chronic obstructive pulmonary disease, unspecified: Secondary | ICD-10-CM | POA: Diagnosis present

## 2016-12-01 DIAGNOSIS — Z885 Allergy status to narcotic agent status: Secondary | ICD-10-CM | POA: Diagnosis not present

## 2016-12-01 DIAGNOSIS — Z881 Allergy status to other antibiotic agents status: Secondary | ICD-10-CM | POA: Diagnosis not present

## 2016-12-01 DIAGNOSIS — F17200 Nicotine dependence, unspecified, uncomplicated: Secondary | ICD-10-CM | POA: Diagnosis present

## 2016-12-01 DIAGNOSIS — Z7951 Long term (current) use of inhaled steroids: Secondary | ICD-10-CM | POA: Diagnosis not present

## 2016-12-01 DIAGNOSIS — M542 Cervicalgia: Secondary | ICD-10-CM | POA: Diagnosis present

## 2016-12-01 DIAGNOSIS — H8109 Meniere's disease, unspecified ear: Secondary | ICD-10-CM | POA: Diagnosis present

## 2016-12-01 DIAGNOSIS — Z886 Allergy status to analgesic agent status: Secondary | ICD-10-CM | POA: Diagnosis not present

## 2016-12-01 DIAGNOSIS — Z888 Allergy status to other drugs, medicaments and biological substances status: Secondary | ICD-10-CM | POA: Diagnosis not present

## 2016-12-01 DIAGNOSIS — M5021 Other cervical disc displacement,  high cervical region: Secondary | ICD-10-CM | POA: Diagnosis present

## 2016-12-01 MED ORDER — HYDROCODONE-ACETAMINOPHEN 7.5-325 MG PO TABS
1.0000 | ORAL_TABLET | ORAL | Status: DC | PRN
  Administered 2016-12-01 – 2016-12-02 (×4): 2 via ORAL
  Filled 2016-12-01 (×4): qty 2

## 2016-12-01 MED ORDER — HYDROCODONE-ACETAMINOPHEN 7.5-325 MG PO TABS: 1.0000 | ORAL_TABLET | Freq: Four times a day (QID) | ORAL | 0 refills | Status: AC | PRN

## 2016-12-01 MED ORDER — METHOCARBAMOL 500 MG PO TABS: 500.0000 mg | ORAL_TABLET | Freq: Four times a day (QID) | ORAL | 0 refills | Status: AC | PRN

## 2016-12-01 MED FILL — Thrombin For Soln 20000 Unit: CUTANEOUS | Qty: 1 | Status: AC

## 2016-12-01 NOTE — Progress Notes (Signed)
Patient ID: Theresa Mahoney, female   DOB: [birth year], 56 y.o.   MRN: [id number] Subjective: Patient reports no change in R arm weakness (pre-op), some neck and R shoulder pain but overall doing well  Objective: Vital signs in last 24 hours: Temp:  [97.3 F (36.3 C)-98.5 F (36.9 C)] 98.3 F (36.8 C) (09/07 0815) Pulse Rate:  [71-93] 71 (09/07 0815) Resp:  [13-21] 18 (09/07 0815) BP: (96-131)/(59-86) 107/66 (09/07 0815) SpO2:  [92 %-100 %] 96 % (09/07 0815) Weight:  [73 kg (161 lb)] 73 kg (161 lb) (09/07 0800)  Intake/Output from previous day: 09/06 0701 - 09/07 0700 In: 1763 [P.O.:360; I.V.:1303; IV Piggyback:100] Out: 120 [Drains:70; Blood:50] Intake/Output this shift: No intake/output data recorded.  Neurologic: Grossly normal with stable RUE weakness compared to pre-op, walking well  Lab Results: Lab Results  Component Value Date   WBC 7.2 11/22/2016   HGB 15.1 (H) 11/22/2016   HCT 44.4 11/22/2016   MCV 89.5 11/22/2016   PLT 247 11/22/2016   Lab Results  Component Value Date   INR 0.86 11/22/2016   BMET Lab Results  Component Value Date   NA 140 11/22/2016   K 3.8 11/22/2016   CL 100 (L) 11/22/2016   CO2 32 11/22/2016   GLUCOSE 86 11/22/2016   BUN 7 11/22/2016   CREATININE 0.91 11/22/2016   CALCIUM 9.4 11/22/2016    Studies/Results: Dg Cervical Spine 1 View  Result Date: 11/30/2016 CLINICAL DATA:  ACDF C3 through C7 EXAM: DG C-ARM 61-120 MIN; DG CERVICAL SPINE - 1 VIEW COMPARISON:  Cervical MRI 05/22/2016 FINDINGS: ACDF C3 through C7. Anterior plate and screws in good position. Interbody metal spacers at C3-4, C4-5, C5-6, C6-7 in satisfactory position. IMPRESSION: ACDF C3 through C7. Electronically Signed   By: Marlan Palauharles  Clark M.D.   On: 11/30/2016 11:52   Dg C-arm 1-60 Min  Result Date: 11/30/2016 CLINICAL DATA:  ACDF C3 through C7 EXAM: DG C-ARM 61-120 MIN; DG CERVICAL SPINE - 1 VIEW COMPARISON:  Cervical MRI 05/22/2016 FINDINGS: ACDF C3 through C7.  Anterior plate and screws in good position. Interbody metal spacers at C3-4, C4-5, C5-6, C6-7 in satisfactory position. IMPRESSION: ACDF C3 through C7. Electronically Signed   By: Marlan Palauharles  Clark M.D.   On: 11/30/2016 11:52    Assessment/Plan: POD 1 s/p 4 level ACDF, JP drain still with too much output, I would prefer she stay one more day for PT/OT and JP drainage with d/c tomorrow   LOS: 1 day    Makye Radle S 12/01/2016, 10:08 AM

## 2016-12-01 NOTE — Progress Notes (Signed)
Physical Therapy Treatment Patient Details Name: Theresa Mahoney MRN: [id number] DOB: 1961/01/16 Today's Date: 12/01/2016    History of Present Illness Pt is a 56 y/o female s/p C3-7 ACDF. PMH includes anxiety, arthritis, COPD, Meniere's disease, bilat shoulder surgery, and current smoker.     PT Comments    Patient doing very well with mobility and gait.  Encouraged ambulation with nsg supervision while in hospital, and to continue walking at home.  Patient able to negotiate flight of stairs with supervision.  Patient has achieved all PT goals and is ready for d/c from PT perspective.  PT will sign off.    Follow Up Recommendations  No PT follow up;Supervision for mobility/OOB     Equipment Recommendations  None recommended by PT    Recommendations for Other Services       Precautions / Restrictions Precautions Precautions: Cervical Precaution Comments: Reviewed cervical precautions Required Braces or Orthoses: Cervical Brace Cervical Brace: Soft collar Restrictions Weight Bearing Restrictions: No    Mobility  Bed Mobility Overal bed mobility: Modified Independent Bed Mobility: Rolling;Sidelying to Sit;Sit to Sidelying Rolling: Modified independent (Device/Increase time) Sidelying to sit: Modified independent (Device/Increase time)     Sit to sidelying: Modified independent (Device/Increase time) General bed mobility comments: Increased time.  Using appropriate technique.  Transfers Overall transfer level: Modified independent Equipment used: None Transfers: Sit to/from Stand Sit to Stand: Modified independent (Device/Increase time)            Ambulation/Gait Ambulation/Gait assistance: Modified independent (Device/Increase time) Ambulation Distance (Feet): 200 Feet Assistive device: None Gait Pattern/deviations: Step-through pattern;Decreased stride length Gait velocity: Decreased Gait velocity interpretation: Below normal speed for  age/gender General Gait Details: Patient with good gait pattern and balance.  Slightly slow.   Stairs Stairs: Yes   Stair Management: One rail Left;Step to pattern;Forwards Number of Stairs: 10 General stair comments: Instructed patient in step-to pattern for safety.  Cues to hold head upright and use feet to locate the next step.  Wheelchair Mobility    Modified Rankin (Stroke Patients Only)       Balance   Sitting-balance support: No upper extremity supported;Feet supported Sitting balance-Leahy Scale: Good     Standing balance support: No upper extremity supported;During functional activity Standing balance-Leahy Scale: Good                              Cognition Arousal/Alertness: Awake/alert Behavior During Therapy: WFL for tasks assessed/performed;Restless Overall Cognitive Status: Within Functional Limits for tasks assessed                                        Exercises      General Comments        Pertinent Vitals/Pain Pain Assessment: 0-10 Pain Score: 6  Pain Location: R arm and neck  Pain Descriptors / Indicators: Aching;Operative site guarding;Constant Pain Intervention(s): Limited activity within patient's tolerance;Monitored during session;Repositioned    Home Living                      Prior Function            PT Goals (current goals can now be found in the care plan section) Acute Rehab PT Goals Patient Stated Goal: To decrease pain and go home Progress towards PT goals: Goals met/education completed, patient discharged from PT  Frequency    Min 5X/week      PT Plan Current plan remains appropriate    Co-evaluation              AM-PAC PT "6 Clicks" Daily Activity  Outcome Measure  Difficulty turning over in bed (including adjusting bedclothes, sheets and blankets)?: None Difficulty moving from lying on back to sitting on the side of the bed? : None Difficulty sitting down on  and standing up from a chair with arms (e.g., wheelchair, bedside commode, etc,.)?: None Help needed moving to and from a bed to chair (including a wheelchair)?: None Help needed walking in hospital room?: None Help needed climbing 3-5 steps with a railing? : A Little 6 Click Score: 23    End of Session Equipment Utilized During Treatment: Gait belt;Cervical collar Activity Tolerance: Patient tolerated treatment well Patient left: in bed;with call bell/phone within reach;with family/visitor present Nurse Communication: Mobility status PT Visit Diagnosis: Unsteadiness on feet (R26.81);Pain Pain - Right/Left: Right Pain - part of body: Arm (neck)     Time: 4562-5638 PT Time Calculation (min) (ACUTE ONLY): 22 min  Charges:  $Gait Training: 8-22 mins                    G Codes:       Carita Pian. Sanjuana Kava, [facility] Acute Rehab Services Pager [place] 12/01/2016, 9:37 PM

## 2016-12-01 NOTE — Therapy (Signed)
Occupational Therapy Evaluation and Discharge Patient Details Name: Theresa Mahoney MRN: [id number] DOB: 1960-10-25 Today's Date: 12/01/2016    History of Present Illness Pt is a 56 y/o female s/p C3-7 ACDF. PMH includes anxiety, arthritis, COPD, Meniere's disease, bilat shoulder surgery, and current smoker.    Clinical Impression   Pt reports being independent with functional mobility and ADLs with some R arm pain and weakness PTA. Currently, pt requires min assist for UB and LB bathing and dressing, min guard to complete shower transfer for safety and supervision/set up for all other ADLs. Pt reports family is available to provide 24 hour assistance as needed. No acute OT needs at this time.     Follow Up Recommendations  No OT follow up;Supervision/Assistance - 24 hour    Equipment Recommendations  None recommended by OT    Recommendations for Other Services       Precautions / Restrictions Precautions Precautions: Cervical Precaution Comments: Reviewed cervical precaution handout with pt.  Required Braces or Orthoses: Cervical Brace Cervical Brace: Soft collar Restrictions Weight Bearing Restrictions: No      Mobility Bed Mobility Overal bed mobility: Needs Assistance Bed Mobility: Supine to Sit;Sit to Supine     Supine to sit: Supervision Sit to supine: Supervision   General bed mobility comments: Pt sitting EOB upon arrival.  Transfers Overall transfer level: Needs assistance Equipment used: None Transfers: Sit to/from Stand Sit to Stand: Supervision         General transfer comment: Supervision for safety.     Balance Overall balance assessment: Needs assistance Sitting-balance support: No upper extremity supported;Feet supported Sitting balance-Leahy Scale: Good     Standing balance support: No upper extremity supported;During functional activity Standing balance-Leahy Scale: Fair                             ADL either  performed or assessed with clinical judgement   ADL Overall ADL's : Needs assistance/impaired     Grooming: Supervision/safety;Set up;Standing   Upper Body Bathing: Minimal assistance   Lower Body Bathing: Minimal assistance;Sitting/lateral leans   Upper Body Dressing : Minimal assistance;Sitting   Lower Body Dressing: Minimal assistance;Sitting/lateral leans   Toilet Transfer: Supervision/safety;Set up;Ambulation;Comfort height toilet   Toileting- Clothing Manipulation and Hygiene: Supervision/safety;Set up;Sitting/lateral lean   Tub/ Shower Transfer: Min guard;Ambulation;Shower Field seismologist Details (indicate cue type and reason): Pt completed shower transfer with min guard for safety and verbal cues for technique. Functional mobility during ADLs: Supervision/safety General ADL Comments: Pt requires min assist for bathing and dressing due to cervical precautions and decreased use of her RUE.  Pt educated on compensatory techniques to complete ADLs with cervical precautions.     Vision Baseline Vision/History: Wears glasses Wears Glasses: At all times       Perception     Praxis      Pertinent Vitals/Pain Pain Assessment: 0-10 Pain Score: 8  Pain Location: R arm and neck  Pain Descriptors / Indicators: Aching;Operative site guarding;Constant Pain Intervention(s): Limited activity within patient's tolerance;Monitored during session     Hand Dominance Right   Extremity/Trunk Assessment Upper Extremity Assessment Upper Extremity Assessment: RUE deficits/detail RUE Deficits / Details: RUE pain and weakness. Reports this at baseline.    Lower Extremity Assessment Lower Extremity Assessment: Overall WFL for tasks assessed   Cervical / Trunk Assessment Cervical / Trunk Assessment: Other exceptions Cervical / Trunk Exceptions: s/p ACDF   Communication Communication Communication: No difficulties  Cognition Arousal/Alertness: Awake/alert Behavior During  Therapy: WFL for tasks assessed/performed Overall Cognitive Status: Within Functional Limits for tasks assessed                                     General Comments  Pt educated on compensatory techniques to complete ADLs.     Exercises     Shoulder Instructions      Home Living Family/patient expects to be discharged to:: Private residence Living Arrangements: Spouse/significant other;Children Available Help at Discharge: Family;Available 24 hours/day Type of Home: Mobile home Home Access: Stairs to enter Entrance Stairs-Number of Steps: 5 Entrance Stairs-Rails: Right;Can reach both;Left Home Layout: One level     Bathroom Shower/Tub: Producer, television/film/videoWalk-in shower   Bathroom Toilet: Handicapped height Bathroom Accessibility: Yes How Accessible: Accessible via walker Home Equipment: Shower seat - built in;Hand held shower head;Grab bars - tub/shower          Prior Functioning/Environment Level of Independence: Independent        Comments: Reports she walked everyday for exercise.         OT Problem List:        OT Treatment/Interventions:      OT Goals(Current goals can be found in the care plan section) Acute Rehab OT Goals Patient Stated Goal: To decrease pain and go home OT Goal Formulation: With patient  OT Frequency:     Barriers to D/C:            Co-evaluation              AM-PAC PT "6 Clicks" Daily Activity     Outcome Measure Help from another person eating meals?: None Help from another person taking care of personal grooming?: None Help from another person toileting, which includes using toliet, bedpan, or urinal?: None Help from another person bathing (including washing, rinsing, drying)?: A Little Help from another person to put on and taking off regular upper body clothing?: A Little Help from another person to put on and taking off regular lower body clothing?: A Little 6 Click Score: 21   End of Session Equipment Utilized  During Treatment: Gait belt Nurse Communication: Mobility status  Activity Tolerance: Patient tolerated treatment well Patient left: in bed;with call bell/phone within reach;with family/visitor present                   Time: [id number] OT Time Calculation (min): 20 min Charges:  OT General Charges $OT Visit: 1 Visit OT Evaluation $OT Eval Moderate Complexity: 1 Mod G-Codes:    Cammy CopaCourtney Meiya Wisler, OTS [phone]   Cammy Copaourtney Tamarius Rosenfield 12/01/2016, 5:06 PM

## 2016-12-01 NOTE — Discharge Summary (Addendum)
Physician Discharge Summary  Patient ID: Theresa Mahoney MRN: [id number] DOB/AGE: 1960-08-13 56 y.o.  Admit date: 11/30/2016 Discharge date: 12/01/2016  Admission Diagnoses:  Cervical spinal stenosis C3-4 C4-5 C5-6 and C6-7 with right arm pain, weakness in the right shoulder    Discharge Diagnoses: same as admitting   Discharged Condition: good  Hospital Course: The patient was admitted on 11/30/2016 and taken to the operating room where the patient underwent anterior cervical decompression and fusion C3-4, C4-5, C5-6, C6-7. The patient tolerated the procedure well and was taken to the recovery room and then to the floor in stable condition. The hospital course was routine. There were no complications. The wound remained clean dry and intact. Pt had appropriate neck soreness. No complaints of new pain or new N/T/W. The patient remained afebrile with stable vital signs, and tolerated a regular diet. The patient continued to increase activities, and pain was well controlled with oral pain medications.   Consults: None  Significant Diagnostic Studies:  Results for orders placed or performed during the hospital encounter of 11/22/16  Surgical pcr screen  Result Value Ref Range   MRSA, PCR NEGATIVE NEGATIVE   Staphylococcus aureus NEGATIVE NEGATIVE  Basic metabolic panel  Result Value Ref Range   Sodium 140 135 - 145 mmol/L   Potassium 3.8 3.5 - 5.1 mmol/L   Chloride 100 (L) 101 - 111 mmol/L   CO2 32 22 - 32 mmol/L   Glucose, Bld 86 65 - 99 mg/dL   BUN 7 6 - 20 mg/dL   Creatinine, Ser 4.09 0.44 - 1.00 mg/dL   Calcium 9.4 8.9 - 81.1 mg/dL   GFR calc non Af Amer >60 >60 mL/min   GFR calc Af Amer >60 >60 mL/min   Anion gap 8 5 - 15  CBC WITH DIFFERENTIAL  Result Value Ref Range   WBC 7.2 4.0 - 10.5 K/uL   RBC 4.96 3.87 - 5.11 MIL/uL   Hemoglobin 15.1 (H) 12.0 - 15.0 g/dL   HCT 91.4 78.2 - 95.6 %   MCV 89.5 78.0 - 100.0 fL   MCH 30.4 26.0 - 34.0 pg   MCHC 34.0 30.0 - 36.0 g/dL    RDW 21.3 08.6 - 57.8 %   Platelets 247 150 - 400 K/uL   Neutrophils Relative % 60 %   Neutro Abs 4.3 1.7 - 7.7 K/uL   Lymphocytes Relative 31 %   Lymphs Abs 2.2 0.7 - 4.0 K/uL   Monocytes Relative 6 %   Monocytes Absolute 0.5 0.1 - 1.0 K/uL   Eosinophils Relative 3 %   Eosinophils Absolute 0.2 0.0 - 0.7 K/uL   Basophils Relative 0 %   Basophils Absolute 0.0 0.0 - 0.1 K/uL  Protime-INR  Result Value Ref Range   Prothrombin Time 11.6 11.4 - 15.2 seconds   INR 0.86     Dg Cervical Spine 1 View  Result Date: 11/30/2016 CLINICAL DATA:  ACDF C3 through C7 EXAM: DG C-ARM 61-120 MIN; DG CERVICAL SPINE - 1 VIEW COMPARISON:  Cervical MRI 05/22/2016 FINDINGS: ACDF C3 through C7. Anterior plate and screws in good position. Interbody metal spacers at C3-4, C4-5, C5-6, C6-7 in satisfactory position. IMPRESSION: ACDF C3 through C7. Electronically Signed   By: Marlan Palau M.D.   On: 11/30/2016 11:52   Dg C-arm 1-60 Min  Result Date: 11/30/2016 CLINICAL DATA:  ACDF C3 through C7 EXAM: DG C-ARM 61-120 MIN; DG CERVICAL SPINE - 1 VIEW COMPARISON:  Cervical MRI 05/22/2016 FINDINGS: ACDF C3  through C7. Anterior plate and screws in good position. Interbody metal spacers at C3-4, C4-5, C5-6, C6-7 in satisfactory position. IMPRESSION: ACDF C3 through C7. Electronically Signed   By: Marlan Palau M.D.   On: 11/30/2016 11:52    Antibiotics:  Anti-infectives    Start     Dose/Rate Route Frequency Ordered Stop   11/30/16 1600  ceFAZolin (ANCEF) IVPB 2g/100 mL premix     2 g 200 mL/hr over 30 Minutes Intravenous Every 8 hours 11/30/16 1358 12/01/16 0018   11/30/16 0803  bacitracin 50,000 Units in sodium chloride irrigation 0.9 % 500 mL irrigation  Status:  Discontinued       As needed 11/30/16 0804 11/30/16 1145   11/30/16 0715  ceFAZolin (ANCEF) IVPB 2g/100 mL premix     2 g 200 mL/hr over 30 Minutes Intravenous On call to O.R. 11/29/16 1154 11/30/16 0745   11/30/16 0981  ceFAZolin (ANCEF) IVPB 2g/100  mL premix  Status:  Discontinued     2 g 200 mL/hr over 30 Minutes Intravenous On call to O.R. 11/30/16 1914 11/30/16 7829      Discharge Exam: Blood pressure 107/66, pulse 71, temperature 98.3 F (36.8 C), temperature source Oral, resp. rate 18, height  (1.702 m), weight 73 kg (161 lb), SpO2 96 %. Neurologic: Grossly normal Ambulating and voiding well   Discharge Medications:   Allergies as of 12/01/2016      Reactions   Celebrex [celecoxib] Other (See Comments)   Abdominal pain     Ciprofloxacin Other (See Comments)   Depression - patient unaware of this.   Guaifenesin Palpitations   Decreased hearing    Aspirin Rash   Severe rash   Erythromycin Tinitus   Nsaids Rash   Severe rash   Oxycodone Itching, Rash      Medication List    TAKE these medications   atorvastatin 40 MG tablet Commonly known as:  LIPITOR Take 40 mg by mouth daily.   CAL-MAG-ZINC PO Take 2 tablets by mouth at bedtime.   cholecalciferol 1000 units tablet Commonly known as:  VITAMIN D Take 1,000 Units by mouth 2 (two) times daily.   diazepam 5 MG tablet Commonly known as:  VALIUM Take 1 tablet by mouth every 8 hours as needed for dizziness or pain   DULoxetine 60 MG capsule Commonly known as:  CYMBALTA Take 60 mg by mouth 2 (two) times daily.   fluticasone 50 MCG/ACT nasal spray Commonly known as:  FLONASE Place 2 sprays into both nostrils daily.   gabapentin 100 MG capsule Commonly known as:  NEURONTIN Take 100 mg by mouth 2 (two) times daily.   GARLIC OIL PO Take 1 capsule by mouth 2 (two) times daily.   hydrochlorothiazide 50 MG tablet Commonly known as:  HYDRODIURIL Take 50 mg by mouth daily.   HYDROcodone-acetaminophen 7.5-325 MG tablet Commonly known as:  NORCO Take 1-2 tablets by mouth every 6 (six) hours as needed for moderate pain.   levothyroxine 75 MCG tablet Commonly known as:  SYNTHROID, LEVOTHROID Take 75 mcg by mouth daily before breakfast.   loratadine 10  MG tablet Commonly known as:  CLARITIN Take 10 mg by mouth every evening.   methocarbamol 500 MG tablet Commonly known as:  ROBAXIN Take 1 tablet (500 mg total) by mouth every 6 (six) hours as needed for muscle spasms.   potassium chloride SA 20 MEQ tablet Commonly known as:  K-DUR,KLOR-CON Take 20 mEq by mouth daily.   promethazine 12.5  MG tablet Commonly known as:  PHENERGAN take 1 tablet by mouth once a day as needed for nausea   verapamil 240 MG CR tablet Commonly known as:  CALAN-SR Take 240 mg by mouth at bedtime.   vitamin A [id number] UNIT capsule Generic drug:  vitamin A Take 10,000 Units by mouth at bedtime.   vitamin C 1000 MG tablet Take 2,000 mg by mouth daily.            Discharge Care Instructions        Start     Ordered   12/01/16 0000  HYDROcodone-acetaminophen (NORCO) 7.5-325 MG tablet  Every 6 hours PRN     12/01/16 0950   12/01/16 0000  methocarbamol (ROBAXIN) 500 MG tablet  Every 6 hours PRN     12/01/16 0950   12/01/16 0000  Increase activity slowly     12/01/16 0950   12/01/16 0000  Diet - low sodium heart healthy     12/01/16 0950   12/01/16 0000  Driving Restrictions    Comments:  No driving 2 weeks   60/45/4007/10/12 0950   12/01/16 0000  Lifting restrictions    Comments:  Nothing heavier than a gallon of milk   12/01/16 0950   12/01/16 0000   Remove dressing in 72 hours     12/01/16 0950   12/01/16 0000  Call MD for:  extreme fatigue     12/01/16 0950   12/01/16 0000  Call MD for:  persistant dizziness or light-headedness     12/01/16 0950   12/01/16 0000  Call MD for:  hives     12/01/16 0950   12/01/16 0000  Call MD for:  difficulty breathing, headache or visual disturbances     12/01/16 0950   12/01/16 0000  Call MD for:  redness, tenderness, or signs of infection (pain, swelling, redness, odor or green/yellow discharge around incision site)     12/01/16 0950   12/01/16 0000  Call MD for:  severe uncontrolled pain     12/01/16 0950    12/01/16 0000  Call MD for:  persistant nausea and vomiting     12/01/16 0950   12/01/16 0000  Call MD for:  temperature >100.4     12/01/16 0950      Disposition: home   Final Dx: same as admitting  Discharge Instructions     Remove dressing in 72 hours    Complete by:  As directed    Call MD for:  difficulty breathing, headache or visual disturbances    Complete by:  As directed    Call MD for:  extreme fatigue    Complete by:  As directed    Call MD for:  hives    Complete by:  As directed    Call MD for:  persistant dizziness or light-headedness    Complete by:  As directed    Call MD for:  persistant nausea and vomiting    Complete by:  As directed    Call MD for:  redness, tenderness, or signs of infection (pain, swelling, redness, odor or green/yellow discharge around incision site)    Complete by:  As directed    Call MD for:  severe uncontrolled pain    Complete by:  As directed    Call MD for:  temperature >100.4    Complete by:  As directed    Diet - low sodium heart healthy    Complete by:  As directed  Driving Restrictions    Complete by:  As directed    No driving 2 weeks   Increase activity slowly    Complete by:  As directed    Lifting restrictions    Complete by:  As directed    Nothing heavier than a gallon of milk         Signed: Tiana Loft Meyran 12/01/2016, 9:51 AM   I am going have her stay one more night with JP in place to reduce the risk of re-admission for swelling/ hematoma in the surgical field. I'll also order PT/Ot as she had fairly profound RUE dysfunction pre-operatively  Dr Maylene Roes output decreased.  Swallowing and voice OK.  Motor exam stable.  Wound flat.  Discharge home and D/C drain.

## 2016-12-01 NOTE — Evaluation (Signed)
Physical Therapy Evaluation Patient Details Name: Theresa Mahoney MRN: [id number] DOB: 1960/08/16 Today's Date: 12/01/2016   History of Present Illness  Pt is a 56 y/o female s/p C3-7 ACDF. PMH includes anxiety, arthritis, COPD, Meniere's disease, bilat shoulder surgery, and current smoker.   Clinical Impression  Pt is s/p surgery above with deficits below. PTA, pt was independent with functional mobility, however, had R arm pain and weakness. Upon eval, pt presenting with decreased balance and R arm pain and weakness, which pt experiences at baseline. Pt requiring occasional min A for balance assist during ambulation, however, otherwise tolerated well. Feel pt will not need follow up PT at home and has necessary assist at home. Will continue to follow acutely to maximize independence and safety with functional mobility.     Follow Up Recommendations No PT follow up;Supervision for mobility/OOB    Equipment Recommendations  None recommended by PT    Recommendations for Other Services       Precautions / Restrictions Precautions Precautions: Cervical Precaution Comments: Reviewed cervical precaution handout with pt.  Required Braces or Orthoses: Cervical Brace Cervical Brace: Soft collar Restrictions Weight Bearing Restrictions: No      Mobility  Bed Mobility Overal bed mobility: Needs Assistance Bed Mobility: Supine to Sit;Sit to Supine     Supine to sit: Supervision Sit to supine: Supervision   General bed mobility comments: Supervision for safety. Educated to use log roll technique to maintain precautions.   Transfers Overall transfer level: Needs assistance Equipment used: None Transfers: Sit to/from Stand Sit to Stand: Supervision         General transfer comment: Supervision for safety.   Ambulation/Gait Ambulation/Gait assistance: Min guard;Supervision Ambulation Distance (Feet): 400 Feet Assistive device: None Gait Pattern/deviations: Step-through  pattern;Decreased stride length Gait velocity: Decreased Gait velocity interpretation: Below normal speed for age/gender General Gait Details: Slow, guarded, and slightly unsteady gait. Pt occaisionally required min guard assist for steadying.   Stairs            Wheelchair Mobility    Modified Rankin (Stroke Patients Only)       Balance Overall balance assessment: Needs assistance Sitting-balance support: No upper extremity supported;Feet supported Sitting balance-Leahy Scale: Good     Standing balance support: No upper extremity supported;During functional activity Standing balance-Leahy Scale: Fair                               Pertinent Vitals/Pain Pain Assessment: 0-10 Pain Score: 9  Pain Location: R arm and neck  Pain Descriptors / Indicators: Aching;Operative site guarding;Constant Pain Intervention(s): Limited activity within patient's tolerance;Monitored during session;Repositioned    Home Living Family/patient expects to be discharged to:: Private residence Living Arrangements: Spouse/significant other;Children Available Help at Discharge: Family;Available 24 hours/day Type of Home: Mobile home Home Access: Stairs to enter Entrance Stairs-Rails: Right;Can reach both;Left Entrance Stairs-Number of Steps: 5 Home Layout: One level Home Equipment: Shower seat - built in      Prior Function Level of Independence: Independent         Comments: Reports she walked everyday for exercise.      Hand Dominance   Dominant Hand: Right    Extremity/Trunk Assessment   Upper Extremity Assessment Upper Extremity Assessment: RUE deficits/detail RUE Deficits / Details: RUE pain and weakness. Reports this at baseline.     Lower Extremity Assessment Lower Extremity Assessment: Overall WFL for tasks assessed    Cervical / Trunk  Assessment Cervical / Trunk Assessment: Other exceptions Cervical / Trunk Exceptions: s/p ACDF  Communication    Communication: No difficulties  Cognition Arousal/Alertness: Awake/alert Behavior During Therapy: WFL for tasks assessed/performed Overall Cognitive Status: Within Functional Limits for tasks assessed                                        General Comments General comments (skin integrity, edema, etc.): Educated about performing generalized walking exercise program at home.     Exercises     Assessment/Plan    PT Assessment Patient needs continued PT services  PT Problem List Decreased balance;Decreased strength;Decreased mobility;Decreased knowledge of precautions;Pain       PT Treatment Interventions Gait training;Stair training;Functional mobility training;Therapeutic activities;Therapeutic exercise;Balance training;Neuromuscular re-education;Patient/family education    PT Goals (Current goals can be found in the Care Plan section)  Acute Rehab PT Goals Patient Stated Goal: to decrease R arm pain  PT Goal Formulation: With patient Time For Goal Achievement: 12/08/16 Potential to Achieve Goals: Good    Frequency Min 5X/week   Barriers to discharge        Co-evaluation               AM-PAC PT "6 Clicks" Daily Activity  Outcome Measure Difficulty turning over in bed (including adjusting bedclothes, sheets and blankets)?: None Difficulty moving from lying on back to sitting on the side of the bed? : None Difficulty sitting down on and standing up from a chair with arms (e.g., wheelchair, bedside commode, etc,.)?: None Help needed moving to and from a bed to chair (including a wheelchair)?: A Little Help needed walking in hospital room?: A Little Help needed climbing 3-5 steps with a railing? : A Little 6 Click Score: 21    End of Session Equipment Utilized During Treatment: Gait belt;Cervical collar Activity Tolerance: Patient tolerated treatment well Patient left: in bed;with call bell/phone within reach;with family/visitor present Nurse  Communication: Mobility status PT Visit Diagnosis: Unsteadiness on feet (R26.81);Pain Pain - Right/Left: Right Pain - part of body: Arm (neck )    Time: [id number] PT Time Calculation (min) (ACUTE ONLY): 20 min   Charges:   PT Evaluation $PT Eval Low Complexity: 1 Low PT Treatments $Gait Training: 8-22 mins   PT G Codes:        Gladys DammeBrittany Murat Rideout, PT, DPT  Acute Rehabilitation Services  Pager: [phone]   Lehman PromBrittany S Sabella Traore 12/01/2016, 1:35 PM

## 2016-12-02 MED ORDER — HYDROCODONE-ACETAMINOPHEN 7.5-325 MG PO TABS: 1.0000 | ORAL_TABLET | ORAL | 0 refills | Status: AC | PRN

## 2016-12-02 NOTE — Progress Notes (Signed)
Subjective: Patient reports doing better  Objective: Vital signs in last 24 hours: Temp:  [97.6 F (36.4 C)-98.3 F (36.8 C)] 97.6 F (36.4 C) (09/08 0400) Pulse Rate:  [68-79] 69 (09/08 0400) Resp:  [18-20] 20 (09/08 0400) BP: (98-110)/(59-70) 103/65 (09/08 0400) SpO2:  [96 %-100 %] 97 % (09/08 0400) Weight:  [161 lb (73 kg)] 161 lb (73 kg) (09/07 0800)  Intake/Output from previous day: 09/07 0730 - 09/08 0729 In: -  Out: 60 [Drains:60] Intake/Output this shift: Total I/O In: -  Out: 60 [Drains:60]  Physical Exam: Dressing flat.  Drain 30 cc.  Right arm weakness unchanged.  Lab Results: No results for input(s): WBC, HGB, HCT, PLT in the last 72 hours. BMET No results for input(s): NA, K, CL, CO2, GLUCOSE, BUN, CREATININE, CALCIUM in the last 72 hours.  Studies/Results: Dg Cervical Spine 1 View  Result Date: 11/30/2016 CLINICAL DATA:  ACDF C3 through C7 EXAM: DG C-ARM 61-120 MIN; DG CERVICAL SPINE - 1 VIEW COMPARISON:  Cervical MRI 05/22/2016 FINDINGS: ACDF C3 through C7. Anterior plate and screws in good position. Interbody metal spacers at C3-4, C4-5, C5-6, C6-7 in satisfactory position. IMPRESSION: ACDF C3 through C7. Electronically Signed   By: Marlan Palauharles  Clark M.D.   On: 11/30/2016 11:52   Dg C-arm 1-60 Min  Result Date: 11/30/2016 CLINICAL DATA:  ACDF C3 through C7 EXAM: DG C-ARM 61-120 MIN; DG CERVICAL SPINE - 1 VIEW COMPARISON:  Cervical MRI 05/22/2016 FINDINGS: ACDF C3 through C7. Anterior plate and screws in good position. Interbody metal spacers at C3-4, C4-5, C5-6, C6-7 in satisfactory position. IMPRESSION: ACDF C3 through C7. Electronically Signed   By: Marlan Palauharles  Clark M.D.   On: 11/30/2016 11:52    Assessment/Plan: Doing well.  Discharge home.    LOS: 2 days    Dorian HeckleSTERN,Audia Amick D, MD 12/02/2016, 6:33 AM

## 2016-12-02 NOTE — Progress Notes (Signed)
Patient is discharged from room 3C07 at this time. Alert and in stable condition. IV site d/c'd and instructions read to patient and spouse with understanding verbalized. Left unit via wheelchair with all belongings at side. 

## 2016-12-04 ENCOUNTER — Encounter (HOSPITAL_COMMUNITY): Payer: Self-pay | Admitting: Neurological Surgery

## 2016-12-04 ENCOUNTER — Inpatient Hospital Stay (HOSPITAL_COMMUNITY)
Admission: AD | Admit: 2016-12-04 | Discharge: 2016-12-10 | DRG: 395 | Disposition: A | Discharge status: Home / Self Care | Payer: Worker's Compensation | Source: Ambulatory Visit | Attending: Neurological Surgery | Admitting: Neurological Surgery

## 2016-12-04 DIAGNOSIS — K567 Ileus, unspecified: Secondary | ICD-10-CM

## 2016-12-04 DIAGNOSIS — H8109 Meniere's disease, unspecified ear: Secondary | ICD-10-CM | POA: Diagnosis present

## 2016-12-04 DIAGNOSIS — Z90710 Acquired absence of both cervix and uterus: Secondary | ICD-10-CM

## 2016-12-04 DIAGNOSIS — F419 Anxiety disorder, unspecified: Secondary | ICD-10-CM | POA: Diagnosis present

## 2016-12-04 DIAGNOSIS — Y839 Surgical procedure, unspecified as the cause of abnormal reaction of the patient, or of later complication, without mention of misadventure at the time of the procedure: Secondary | ICD-10-CM | POA: Diagnosis present

## 2016-12-04 DIAGNOSIS — K9189 Other postprocedural complications and disorders of digestive system: Secondary | ICD-10-CM | POA: Diagnosis present

## 2016-12-04 DIAGNOSIS — E039 Hypothyroidism, unspecified: Secondary | ICD-10-CM | POA: Diagnosis present

## 2016-12-04 DIAGNOSIS — Z888 Allergy status to other drugs, medicaments and biological substances status: Secondary | ICD-10-CM | POA: Diagnosis not present

## 2016-12-04 DIAGNOSIS — F17210 Nicotine dependence, cigarettes, uncomplicated: Secondary | ICD-10-CM | POA: Diagnosis present

## 2016-12-04 DIAGNOSIS — R1310 Dysphagia, unspecified: Secondary | ICD-10-CM

## 2016-12-04 DIAGNOSIS — M1990 Unspecified osteoarthritis, unspecified site: Secondary | ICD-10-CM | POA: Diagnosis present

## 2016-12-04 DIAGNOSIS — Z886 Allergy status to analgesic agent status: Secondary | ICD-10-CM | POA: Diagnosis not present

## 2016-12-04 DIAGNOSIS — Z881 Allergy status to other antibiotic agents status: Secondary | ICD-10-CM

## 2016-12-04 DIAGNOSIS — R1313 Dysphagia, pharyngeal phase: Secondary | ICD-10-CM | POA: Diagnosis present

## 2016-12-04 DIAGNOSIS — Z7951 Long term (current) use of inhaled steroids: Secondary | ICD-10-CM

## 2016-12-04 DIAGNOSIS — Z885 Allergy status to narcotic agent status: Secondary | ICD-10-CM | POA: Diagnosis not present

## 2016-12-04 DIAGNOSIS — J449 Chronic obstructive pulmonary disease, unspecified: Secondary | ICD-10-CM | POA: Diagnosis present

## 2016-12-04 DIAGNOSIS — M79603 Pain in arm, unspecified: Secondary | ICD-10-CM

## 2016-12-04 MED ORDER — METHOCARBAMOL 1000 MG/10ML IJ SOLN
500.0000 mg | Freq: Four times a day (QID) | INTRAMUSCULAR | Status: DC | PRN
  Administered 2016-12-05 – 2016-12-08 (×3): 500 mg via INTRAVENOUS
  Filled 2016-12-04 (×4): qty 5

## 2016-12-04 MED ORDER — PHENOL 1.4 % MT LIQD
1.0000 | OROMUCOSAL | Status: DC | PRN
  Administered 2016-12-09: 1 via OROMUCOSAL
  Filled 2016-12-04: qty 177

## 2016-12-04 MED ORDER — HYDROCODONE-ACETAMINOPHEN 7.5-325 MG PO TABS
1.0000 | ORAL_TABLET | Freq: Four times a day (QID) | ORAL | Status: DC | PRN
  Filled 2016-12-04: qty 2

## 2016-12-04 MED ORDER — GABAPENTIN 100 MG PO CAPS
100.0000 mg | ORAL_CAPSULE | Freq: Two times a day (BID) | ORAL | Status: DC
  Administered 2016-12-08 – 2016-12-10 (×4): 100 mg via ORAL
  Filled 2016-12-04 (×5): qty 1

## 2016-12-04 MED ORDER — MORPHINE SULFATE (PF) 2 MG/ML IV SOLN
1.0000 mg | INTRAVENOUS | Status: DC | PRN
  Administered 2016-12-04 – 2016-12-08 (×11): 1 mg via INTRAVENOUS
  Filled 2016-12-04 (×10): qty 1

## 2016-12-04 MED ORDER — ACETAMINOPHEN 650 MG RE SUPP
650.0000 mg | RECTAL | Status: DC | PRN
  Administered 2016-12-07: 650 mg via RECTAL
  Filled 2016-12-04: qty 1

## 2016-12-04 MED ORDER — ONDANSETRON HCL 4 MG/2ML IJ SOLN
4.0000 mg | Freq: Four times a day (QID) | INTRAMUSCULAR | Status: DC | PRN
  Administered 2016-12-05 – 2016-12-07 (×5): 4 mg via INTRAVENOUS
  Filled 2016-12-04 (×5): qty 2

## 2016-12-04 MED ORDER — DEXAMETHASONE SODIUM PHOSPHATE 4 MG/ML IJ SOLN
4.0000 mg | Freq: Four times a day (QID) | INTRAMUSCULAR | Status: DC
  Administered 2016-12-04 – 2016-12-07 (×14): 4 mg via INTRAVENOUS
  Filled 2016-12-04 (×15): qty 1

## 2016-12-04 MED ORDER — SENNA 8.6 MG PO TABS
1.0000 | ORAL_TABLET | Freq: Two times a day (BID) | ORAL | Status: DC
  Administered 2016-12-08 – 2016-12-10 (×4): 8.6 mg via ORAL
  Filled 2016-12-04 (×5): qty 1

## 2016-12-04 MED ORDER — DULOXETINE HCL 60 MG PO CPEP
60.0000 mg | ORAL_CAPSULE | Freq: Two times a day (BID) | ORAL | Status: DC
  Administered 2016-12-08 – 2016-12-10 (×4): 60 mg via ORAL
  Filled 2016-12-04 (×5): qty 1

## 2016-12-04 MED ORDER — VERAPAMIL HCL ER 240 MG PO TBCR
240.0000 mg | EXTENDED_RELEASE_TABLET | Freq: Every day | ORAL | Status: DC
  Administered 2016-12-08 – 2016-12-09 (×2): 240 mg via ORAL
  Filled 2016-12-04 (×6): qty 1

## 2016-12-04 MED ORDER — METHOCARBAMOL 500 MG PO TABS
500.0000 mg | ORAL_TABLET | Freq: Four times a day (QID) | ORAL | Status: DC | PRN
Start: 2016-12-04 — End: 2016-12-10
  Administered 2016-12-06: 500 mg via ORAL
  Filled 2016-12-04: qty 1

## 2016-12-04 MED ORDER — NICOTINE 14 MG/24HR TD PT24
14.0000 mg | MEDICATED_PATCH | Freq: Every day | TRANSDERMAL | Status: DC
  Administered 2016-12-04 – 2016-12-10 (×7): 14 mg via TRANSDERMAL
  Filled 2016-12-04 (×7): qty 1

## 2016-12-04 MED ORDER — MENTHOL 3 MG MT LOZG: 1.0000 | LOZENGE | OROMUCOSAL | Status: DC | PRN

## 2016-12-04 MED ORDER — ONDANSETRON HCL 4 MG PO TABS: 4.0000 mg | ORAL_TABLET | Freq: Four times a day (QID) | ORAL | Status: DC | PRN

## 2016-12-04 MED ORDER — ACETAMINOPHEN 325 MG PO TABS: 650.0000 mg | ORAL_TABLET | ORAL | Status: DC | PRN

## 2016-12-04 MED ORDER — HYDROCHLOROTHIAZIDE 25 MG PO TABS
50.0000 mg | ORAL_TABLET | Freq: Every day | ORAL | Status: DC
  Administered 2016-12-09 – 2016-12-10 (×2): 50 mg via ORAL
  Filled 2016-12-04 (×3): qty 2

## 2016-12-04 MED ORDER — DEXAMETHASONE 4 MG PO TABS: 4.0000 mg | ORAL_TABLET | Freq: Four times a day (QID) | ORAL | Status: DC

## 2016-12-04 MED ORDER — POTASSIUM CHLORIDE IN NACL 20-0.9 MEQ/L-% IV SOLN
INTRAVENOUS | Status: DC
  Administered 2016-12-04 – 2016-12-09 (×8): via INTRAVENOUS
  Filled 2016-12-04 (×10): qty 1000

## 2016-12-04 MED ORDER — METHOCARBAMOL 500 MG PO TABS
500.0000 mg | ORAL_TABLET | Freq: Four times a day (QID) | ORAL | Status: DC | PRN
  Filled 2016-12-04: qty 1

## 2016-12-04 MED ORDER — LEVOTHYROXINE SODIUM 75 MCG PO TABS: 75.0000 ug | ORAL_TABLET | Freq: Every day | ORAL | Status: DC

## 2016-12-04 NOTE — Progress Notes (Signed)
Received from admitting department; patient alert without acute distress; oriented patient and her spouse to room and unit routine; orders for admission carried out.

## 2016-12-04 NOTE — H&P (Signed)
Subjective:   Patient is a 56 y.o. female seen regarding   Dysphagia after 4 level ACDF. The patient first presented with complaints of  dysphagia. Onset of symptoms was 1 day ago, stable since that time. Onset Was related to  Recent surgery at the end of last week. The pain is described as aching and occurs all day. The pain is rated moderate, and is located at the  [place] of the neck.  I saw her in the office today and noted some mild swelling.  She is being admitted for IV hydration and IV steroids  Past Medical History:  Diagnosis Date  . Anxiety   . Arthritis   . COPD (chronic obstructive pulmonary disease) (HCC)   . Depression   . Headache   . Hypothyroidism   . Meniere's disease     Past Surgical History:  Procedure Laterality Date  . ABDOMINAL HYSTERECTOMY    . CESAREAN SECTION     1984, 1986  . FOOT SURGERY  2015, 2017   bone spurs  . SHOULDER ARTHROSCOPY     RT 01/2016   . SHOULDER SURGERY Bilateral 2011   bone spurs    Allergies  Allergen Reactions  . Celebrex [Celecoxib] Other (See Comments)    Abdominal pain    . Ciprofloxacin Other (See Comments)    Depression - patient unaware of this.  . Guaifenesin Palpitations    Decreased hearing   . Aspirin Rash    Severe rash  . Erythromycin Tinitus  . Nsaids Rash    Severe rash  . Oxycodone Itching and Rash    Social History  Substance Use Topics  . Smoking status: Current Every Day Smoker    Packs/day: 0.50    Years: 15.00  . Smokeless tobacco: Never Used  . Alcohol use No    History reviewed. No pertinent family history. Prior to Admission medications   Medication Sig Start Date End Date Taking? Authorizing Provider  Ascorbic Acid (VITAMIN C) 1000 MG tablet Take 2,000 mg by mouth daily.    [provider]  atorvastatin (LIPITOR) 40 MG tablet Take 40 mg by mouth daily.  09/08/16   [provider]  Calcium-Magnesium-Zinc (CAL-MAG-ZINC PO) Take 2 tablets by mouth at bedtime.     [provider]  cholecalciferol (VITAMIN D) 1000 units tablet Take 1,000 Units by mouth 2 (two) times daily.     [provider]  diazepam (VALIUM) 5 MG tablet Take 1 tablet by mouth every 8 hours as needed for dizziness or pain 08/14/16   [provider]  DULoxetine (CYMBALTA) 60 MG capsule Take 60 mg by mouth 2 (two) times daily. 09/10/16   [provider]  fluticasone (FLONASE) 50 MCG/ACT nasal spray Place 2 sprays into both nostrils daily.    [provider]  gabapentin (NEURONTIN) 100 MG capsule Take 100 mg by mouth 2 (two) times daily. 09/13/16   [provider]  GARLIC OIL PO Take 1 capsule by mouth 2 (two) times daily.    [provider]  hydrochlorothiazide (HYDRODIURIL) 50 MG tablet Take 50 mg by mouth daily.  08/22/16   [provider]  HYDROcodone-acetaminophen (NORCO) 7.5-325 MG tablet Take 1-2 tablets by mouth every 6 (six) hours as needed for moderate pain. 12/01/16   Meyran, Tiana Loft, NP  HYDROcodone-acetaminophen (NORCO) 7.5-325 MG tablet Take 1-2 tablets by mouth every 4 (four) hours as needed for moderate pain. 12/02/16   Maeola Harman, MD  levothyroxine (SYNTHROID, LEVOTHROID) 73  MCG tablet Take 75 mcg by mouth daily before breakfast. 09/26/16   [provider]  loratadine (CLARITIN) 10 MG tablet Take 10 mg by mouth every evening.    [provider]  methocarbamol (ROBAXIN) 500 MG tablet Take 1 tablet (500 mg total) by mouth every 6 (six) hours as needed for muscle spasms. 12/01/16   Meyran, Tiana Loft, NP  potassium chloride SA (K-DUR,KLOR-CON) 20 MEQ tablet Take 20 mEq by mouth daily. 08/22/16   [provider]  promethazine (PHENERGAN) 12.5 MG tablet take 1 tablet by mouth once a day as needed for nausea 08/14/16   [provider]  verapamil (CALAN-SR) 240 MG CR tablet Take 240 mg by mouth at bedtime.  08/29/16   [provider]  vitamin A (VITAMIN A) 10000 UNIT capsule Take  10,000 Units by mouth at bedtime.     [provider]     Review of Systems  Positive ROS:  negative  All other systems have been reviewed and were otherwise negative with the exception of those mentioned in the HPI and as above.  Objective: Vital signs in last 24 hours:    General Appearance: Alert, cooperative, no distress, appears stated age Head: Normocephalic, without obvious abnormality, atraumatic Eyes: PERRL, conjunctiva/corneas clear, EOM's intact, fundi benign, both eyes      Ears: Normal TM's and external ear canals, both ears Throat: Lips, mucosa, and tongue normal; teeth and gums normal Neck: mild swelling at the surgical site with mild tracheal deviation to the left Back: Symmetric, no curvature, ROM normal, no CVA tenderness Lungs: Clear to auscultation bilaterally, respirations unlabored Heart: Regular rate and rhythm, S1 and S2 normal, no murmur, rub or gallop Abdomen: Soft, non-tender, bowel sounds active all four quadrants, no masses, no organomegaly Extremities: Extremities normal, atraumatic, no cyanosis or edema Pulses: 2+ and symmetric all extremities Skin: Skin color, texture, turgor normal, no rashes or lesions  NEUROLOGIC:   Mental status: A&O x4, no aphasia, good AS, fund of knowledge and memory Motor Exam - grossly normal except for stable preoperative weakness of the right C5 root Sensory Exam - grossly normal Reflexes:1 Coordination - grossly normal Gait - grossly normal Balance - grossly normal Cranial Nerves: I: smell Not tested  II: visual acuity  OS: na    OD: na  II: visual fields Full to confrontation  II: pupils Equal, round, reactive to light  III,VII: ptosis None  III,IV,VI: extraocular muscles  Full ROM  V: mastication Normal  V: facial light touch sensation  Normal  V,VII: corneal reflex  Present  VII: facial muscle function - upper  Normal  VII: facial muscle function - lower Normal  VIII: hearing Not tested  IX: soft  palate elevation  Normal  IX,X: gag reflex Present  XI: trapezius strength  5/5  XI: sternocleidomastoid strength 5/5  XI: neck flexion strength  5/5  XII: tongue strength  Normal    Data Review Lab Results  Component Value Date   WBC 7.2 11/22/2016   HGB 15.1 (H) 11/22/2016   HCT 44.4 11/22/2016   MCV 89.5 11/22/2016   PLT 247 11/22/2016   Lab Results  Component Value Date   NA 140 11/22/2016   K 3.8 11/22/2016   CL 100 (L) 11/22/2016   CO2 32 11/22/2016   BUN 7 11/22/2016   CREATININE 0.91 11/22/2016   GLUCOSE 86 11/22/2016   Lab Results  Component Value Date   INR 0.86 11/22/2016    Assessment/Plan: Postoperative dysphagia  after 4 level ACDF.  Start with IV steroids and IV hydration.  I suspect we can hold off on CT scan or re-exploration at this point.   Aarian Cleaver S 12/04/2016 2:36 PM

## 2016-12-05 MED ORDER — LABETALOL HCL 5 MG/ML IV SOLN: 10.0000 mg | INTRAVENOUS | Status: DC | PRN

## 2016-12-05 MED ORDER — PANTOPRAZOLE SODIUM 40 MG IV SOLR
40.0000 mg | Freq: Every day | INTRAVENOUS | Status: DC
  Administered 2016-12-05 – 2016-12-09 (×5): 40 mg via INTRAVENOUS
  Filled 2016-12-05 (×5): qty 40

## 2016-12-05 MED ORDER — LEVOTHYROXINE SODIUM 100 MCG IV SOLR
37.5000 ug | Freq: Every day | INTRAVENOUS | Status: DC
  Administered 2016-12-05 – 2016-12-10 (×4): 37.5 ug via INTRAVENOUS
  Filled 2016-12-05 (×4): qty 5

## 2016-12-05 NOTE — Care Management Note (Signed)
Case Management Note  Patient Details  Name: Theresa BlowCheryl Michael Erler MRN: [id number] Date of Birth: 05-Apr-1960  Subjective/Objective:   Pt admitted with dysphagia. She is from home with her spouse.                  Action/Plan: Plan is for patient to return home when medically stable. CM following.  Expected Discharge Date:                  Expected Discharge Plan:  Home/Self Care  In-House Referral:     Discharge planning Services     Post Acute Care Choice:    Choice offered to:     DME Arranged:    DME Agency:     HH Arranged:    HH Agency:     Status of Service:  In process, will continue to follow  If discussed at Long Length of Stay Meetings, dates discussed:    Additional Comments:  Kermit BaloKelli F Saphyra Hutt, RN 12/05/2016, 12:28 PM

## 2016-12-05 NOTE — Progress Notes (Signed)
Subjective: Patient reports difficulty swallowing still. She has tried a couple sips of coffee and coke, both of which she got choked on when trying to swallow. Reports neck pain and nausea. Denies any difficulty breathing.   Objective: Vital signs in last 24 hours: Temp:  [97.5 F (36.4 C)-98.6 F (37 C)] 97.5 F (36.4 C) (09/11 0502) Pulse Rate:  [70-100] 70 (09/11 0502) Resp:  [18-20] 18 (09/11 0502) BP: (106-136)/(55-72) 136/55 (09/11 0502) SpO2:  [94 %-100 %] 100 % (09/11 0502)  Intake/Output from previous day: 09/10 0701 - 09/11 0700 In: 956.3 [I.V.:956.3] Out: -  Intake/Output this shift: No intake/output data recorded.  Neurologic: Grossly normal  Lab Results: Lab Results  Component Value Date   WBC 7.2 11/22/2016   HGB 15.1 (H) 11/22/2016   HCT 44.4 11/22/2016   MCV 89.5 11/22/2016   PLT 247 11/22/2016   Lab Results  Component Value Date   INR 0.86 11/22/2016   BMET Lab Results  Component Value Date   NA 140 11/22/2016   K 3.8 11/22/2016   CL 100 (L) 11/22/2016   CO2 32 11/22/2016   GLUCOSE 86 11/22/2016   BUN 7 11/22/2016   CREATININE 0.91 11/22/2016   CALCIUM 9.4 11/22/2016    Studies/Results: No results found.  Assessment/Plan: Continue IV steroids for another day. Zofran and pain medication to control nausea and pain. Started on protonix. Will assess swallowing again tomorrow.   LOS: 1 day    Theresa Mahoney 12/05/2016, 8:39 AM

## 2016-12-06 ENCOUNTER — Inpatient Hospital Stay (HOSPITAL_COMMUNITY): Payer: Worker's Compensation

## 2016-12-06 ENCOUNTER — Encounter (HOSPITAL_COMMUNITY): Payer: Self-pay | Admitting: Neurological Surgery

## 2016-12-06 MED ORDER — RESOURCE THICKENUP CLEAR PO POWD
ORAL | Status: DC | PRN
  Administered 2016-12-06: 23:00:00 via ORAL
  Filled 2016-12-06 (×2): qty 125

## 2016-12-06 NOTE — Progress Notes (Signed)
Patient ambulated to bathroom and back to bed x 2, tolerating ambulation well. Patient did not get any relief from 1 mg IV morphine. Patient unable to swallow pills at this time, IV robaxin administered with effective pain relief. Patient states she had small, sticky bowel movement on 12/05/16. Patient started c/o abdominal pain and nausea at 0740 during bedside shift report. Patient is very anxious but does not want to take any medication by mouth. Ice applied to surgical incision and heat applied to right arm for muscle spasms. RN will continue to monitor.

## 2016-12-06 NOTE — Progress Notes (Signed)
Patient sitting up in bed, HOB elevated.  Collar in place to neck.  Steri strips in place with noted old drainage.  Alert, verbal.  Able to take in nectar thick liquids.  Medication crushed mixed with thickened apple juice.  Patient tolerated well.  Spouse at bedside.

## 2016-12-06 NOTE — Progress Notes (Signed)
Patient O2 sat maintained at 98%. Patient encouraged to drink room temperature or warm liquids. Patient did have oral cyanosis with anxiety this morning that resolved with deep breathing techniques. Swallow evaluation for patient entered. RN will continue to monitor patient.

## 2016-12-06 NOTE — Evaluation (Signed)
Clinical/Bedside Swallow Evaluation Patient Details  Name: Theodora BlowCheryl Michael Mowers MRN: [id number] Date of Birth: 05-29-1960  Today's Date: 12/06/2016 Time: SLP Start Time (ACUTE ONLY): 1431 SLP Stop Time (ACUTE ONLY): 1500 SLP Time Calculation (min) (ACUTE ONLY): 29 min  Past Medical History:  Past Medical History:  Diagnosis Date  . Anxiety   . Arthritis   . COPD (chronic obstructive pulmonary disease) (HCC)   . Depression   . Headache   . Hypothyroidism   . Meniere's disease    Past Surgical History:  Past Surgical History:  Procedure Laterality Date  . ABDOMINAL HYSTERECTOMY    . ANTERIOR CERVICAL DECOMP/DISCECTOMY FUSION N/A 11/30/2016   Procedure: ACDF  C3-C4 C4-C5 C5-C6 C6-C7;  Surgeon: Tia AlertJones, David S, MD;  [facility];  Service: Neurosurgery;  Laterality: N/A;  . CESAREAN SECTION     1984, 1986  . FOOT SURGERY  2015, 2017   bone spurs  . SHOULDER ARTHROSCOPY     RT 01/2016   . SHOULDER SURGERY Bilateral 2011   bone spurs   HPI:  56 y.o.femaleseen regarding dysphagia after 4 level ACDF 11/30/16. PMH: Meniere's disease, hypothyroidism, COPD, anxiety.   Assessment / Plan / Recommendation Clinical Impression  Pt exhibits reversable dysphagia likely due to edema associated with recent ACDF surgery. Poor airway protection suspected with immediate cough, multiple swallows and delayed throat clears with thin liquid. Nectar thick actually mitigated symptoms to delayed throat clears and multiple swallows. Recommend MBS tomorrow; full liquid diet with all liquids thickened to nectar consistency, multiple swallows.  SLP Visit Diagnosis: Dysphagia, pharyngeal phase (R13.13)    Aspiration Risk  Severe aspiration risk    Diet Recommendation Nectar-thick liquid (full liquid diet)   Liquid Administration via: Cup Medication Administration: Crushed with puree Supervision: Patient able to self feed;Full supervision/cueing for compensatory strategies Compensations: Slow  rate;Small sips/bites;Multiple dry swallows after each bite/sip Postural Changes: Seated upright at 90 degrees    Other  Recommendations Oral Care Recommendations: Oral care BID   Follow up Recommendations None      Frequency and Duration            Prognosis        Swallow Study   General HPI: 56 y.o.femaleseen regarding dysphagia after 4 level ACDF 11/30/16. PMH: Meniere's disease, hypothyroidism, COPD, anxiety. Type of Study: Bedside Swallow Evaluation Previous Swallow Assessment:  (none) Diet Prior to this Study: Thin liquids (clears) Temperature Spikes Noted: No Respiratory Status: Room air History of Recent Intubation: No Behavior/Cognition: Alert;Pleasant mood;Cooperative Oral Cavity Assessment: Within Functional Limits Oral Care Completed by SLP: No Oral Cavity - Dentition: Adequate natural dentition Vision: Functional for self-feeding Self-Feeding Abilities: Able to feed self Patient Positioning: Upright in bed Baseline Vocal Quality: Normal (slightly hoarse?) Volitional Swallow: Able to elicit    Oral/Motor/Sensory Function Overall Oral Motor/Sensory Function: Within functional limits   Ice Chips Ice chips: Not tested   Thin Liquid Thin Liquid: Impaired Presentation: Straw Pharyngeal  Phase Impairments: Multiple swallows;Cough - Immediate    Nectar Thick Nectar Thick Liquid: Impaired Presentation: Cup Pharyngeal Phase Impairments: Multiple swallows;Throat Clearing - Delayed   Honey Thick Honey Thick Liquid: Not tested   Puree Puree: Impaired Presentation: Spoon;Self Fed Pharyngeal Phase Impairments: Throat Clearing - Delayed;Throat Clearing - Immediate   Solid   GO   Solid: Not tested        Percell LocusLitaker, Nirali Magouirk Willis   Nan Maya Willis Stephania Macfarlane M.Ed [employer]SL[employer] Pager [phone]   12/06/2016,3:18 PM

## 2016-12-06 NOTE — Progress Notes (Signed)
NEUROSURGERY PROGRESS NOTE  Continues having difficulty swallowing liquids. She has a history of anxiety and feels that she is tearful and overwhelmed at times. Attempted to take a small sip of coffee and choked while doing so. Will continue decadron and give it more time. Incision CDI.   Temp:  [97.7 F (36.5 C)-98.8 F (37.1 C)] 97.7 F (36.5 C) (09/12 0425) Pulse Rate:  [65-73] 72 (09/12 0425) Resp:  [16-18] 18 (09/12 0425) BP: (112-137)/(60-84) 137/76 (09/12 0425) SpO2:  [92 %-99 %] 98 % (09/12 0425) Weight:  [74.5 kg (164 lb 4.8 oz)] 74.5 kg (164 lb 4.8 oz) (09/12 0425)   Sherryl MangesKimberly Hannah Riya Huxford, NP 12/06/2016 8:48 AM

## 2016-12-06 NOTE — Progress Notes (Signed)
Patient ID: Theresa Mahoney, female   DOB: [birth year], 56 y.o.   MRN: [id number] Subjective: Patient reports continued dysphagia. I had her swallow a small sip of coffee. Trachea deviated mildly to L. Pain is appropriate in location and severity (moderate and post neck), walked the halls.  Objective: Vital signs in last 24 hours: Temp:  [97.7 F (36.5 C)-98.8 F (37.1 C)] 97.7 F (36.5 C) (09/12 0425) Pulse Rate:  [65-73] 72 (09/12 0425) Resp:  [16-18] 18 (09/12 0425) BP: (112-137)/(60-84) 137/76 (09/12 0425) SpO2:  [92 %-99 %] 98 % (09/12 0425) Weight:  [74.5 kg (164 lb 4.8 oz)] 74.5 kg (164 lb 4.8 oz) (09/12 0425)  Intake/Output from previous day: 09/11 0701 - 09/12 0700 In: 2342.5 [P.O.:340; I.V.:2002.5] Out: -  Intake/Output this shift: No intake/output data recorded.  Exam unchanged, stable RUE weakness, only mild swelling at surgical site, trach mildly deviated, no resp issues, voice ok Lab Results: Lab Results  Component Value Date   WBC 7.2 11/22/2016   HGB 15.1 (H) 11/22/2016   HCT 44.4 11/22/2016   MCV 89.5 11/22/2016   PLT 247 11/22/2016   Lab Results  Component Value Date   INR 0.86 11/22/2016   BMET Lab Results  Component Value Date   NA 140 11/22/2016   K 3.8 11/22/2016   CL 100 (L) 11/22/2016   CO2 32 11/22/2016   GLUCOSE 86 11/22/2016   BUN 7 11/22/2016   CREATININE 0.91 11/22/2016   CALCIUM 9.4 11/22/2016    Studies/Results: No results found.  Assessment/Plan: CT soft tissues neck to see how much of this is swelling and how much is seroma. If seroma, then evacuation may reduce the duration of symptoms/ dysphagia. No airway compromise is evident on exam and her voice is ok, so surgical exploration is not emergent or urgent.   LOS: 2 days    Ryland Smoots S 12/06/2016, 8:47 AM

## 2016-12-07 ENCOUNTER — Inpatient Hospital Stay (HOSPITAL_COMMUNITY): Payer: Worker's Compensation

## 2016-12-07 NOTE — Progress Notes (Signed)
  Speech Language Pathology Treatment: Dysphagia  Patient Details Name: Theresa Mahoney MRN: [id number] DOB: 1960-12-19 Today's Date: 12/07/2016 Time: [id number] SLP Time Calculation (min) (ACUTE ONLY): 17 min  Assessment / Plan / Recommendation Clinical Impression  Pt seen at bedside following MBS with trials of thin liquids (juice vs barium during MBS) to further ensure safety and success with recommendation of thin. She consumed thin cranberry juice and coffee with decreased s/s aspiration and decreased effrot than nectar compared to yesterday. Pt states thin is easier to swallow as well. Will continue to follow.    HPI HPI: 56 y.o.femaleseen regarding dysphagia after 4 level ACDF 11/30/16. PMH: Meniere's disease, hypothyroidism, COPD, anxiety.      SLP Plan  Continue with current plan of care       Recommendations  Diet recommendations: Thin liquid (full liquids) Liquids provided via: Cup Medication Administration: Crushed with puree Supervision: Patient able to self feed Compensations: Slow rate;Small sips/bites;Multiple dry swallows after each bite/sip;Clear throat after each swallow Postural Changes and/or Swallow Maneuvers: Seated upright 90 degrees                Oral Care Recommendations: Oral care BID Follow up Recommendations: None SLP Visit Diagnosis: Dysphagia, pharyngoesophageal phase (R13.14) Plan: Continue with current plan of care       GO                Royce MacadamiaLitaker, Kristina Mcnorton Willis 12/07/2016, 1:39 PM  Breck CoonsLisa Willis Lonell FaceLitaker M.Ed [employer] Pager [phone]

## 2016-12-07 NOTE — Progress Notes (Signed)
Modified Barium Swallow Progress Note  Patient Details  Name: Theresa Mahoney MRN: [id number] Date of Birth: 1960-09-29  Today's Date: 12/07/2016  Modified Barium Swallow completed.  Full report located under Chart Review in the Imaging Section.  Brief recommendations include the following:  Clinical Impression  Pt exhibits mild-moderate reversible pharyngeal dysphagia. Noteable pharyngeal and cervical esophageal edema prevents full epiglottic deflection resulting in minimal penetration x 1 clearing vestibule with reflexive coughs and multiple swallows. Mild vallecular residue present, increased with nectar consistency and in pyriform sinuses; pt spontaneously performs multiple swallows and eventually can clear. Suboptimal pharyngeal contraction led to brief entrance of nectar into the nasopharynx. Intermittent hesitation at cervical due to decreased esophageal pressure. Recommend continue full liquid texture and upgrade to thin liquids, small sips, multiple swallows and throat clear/coughs. ST will follow for safety and efficiency and upgrade when able   Swallow Evaluation Recommendations           Liquid Administration via: Cup;No straw   Medication Administration: Crushed with puree   Supervision: Patient able to self feed   Compensations: Multiple dry swallows after each bite/sip;Clear throat after each swallow;Small sips/bites   Postural Changes: Seated upright at 90 degrees   Oral Care Recommendations: Oral care BID        Theresa Mahoney, [name] 12/07/2016,1:37 PM   Breck CoonsLisa Willis Lonell FaceLitaker M.Ed [employer] Pager [phone]

## 2016-12-07 NOTE — Progress Notes (Signed)
Patient ID: Theresa BlowCheryl Michael Mahoney, female   DOB: 01/20/1961, 56 y.o.   MRN: [id number] Subjective: Patient reports approp soreness, swallowing getting slowly better  Objective: Vital signs in last 24 hours: Temp:  [97.9 F (36.6 C)-98.8 F (37.1 C)] 98.8 F (37.1 C) (09/13 1338) Pulse Rate:  [40-110] 61 (09/13 1338) Resp:  [16-18] 18 (09/13 1338) BP: (118-156)/(69-85) 127/83 (09/13 1338) SpO2:  [98 %-100 %] 100 % (09/13 1338)  Intake/Output from previous day: 09/12 0701 - 09/13 0700 In: 1197.5 [P.O.:360; I.V.:837.5] Out: -  Intake/Output this shift: Total I/O In: 30 [P.O.:30] Out: -   Neurologic: Grossly normal with stable RUE weakness, stable mild swelling in neck  Lab Results: Lab Results  Component Value Date   WBC 7.2 11/22/2016   HGB 15.1 (H) 11/22/2016   HCT 44.4 11/22/2016   MCV 89.5 11/22/2016   PLT 247 11/22/2016   Lab Results  Component Value Date   INR 0.86 11/22/2016   BMET Lab Results  Component Value Date   NA 140 11/22/2016   K 3.8 11/22/2016   CL 100 (L) 11/22/2016   CO2 32 11/22/2016   GLUCOSE 86 11/22/2016   BUN 7 11/22/2016   CREATININE 0.91 11/22/2016   CALCIUM 9.4 11/22/2016    Studies/Results: Dg Abd 1 View  Result Date: 12/06/2016 CLINICAL DATA:  Ileus. EXAM: ABDOMEN - 1 VIEW COMPARISON:  None. FINDINGS: The bowel gas pattern is normal. Mild degenerate changes are noted in the lumbar spine. There is no evidence for obstruction or free air. IMPRESSION: 1. Normal bowel gas pattern. 2. Degenerative changes of the lumbar spine. Electronically Signed   By: Marin Robertshristopher  Mattern M.D.   On: 12/06/2016 09:37   Ct Soft Tissue Neck Wo Contrast  Result Date: 12/06/2016 CLINICAL DATA:  Initial evaluation for dysphagia of following for level ACDF, recent surgery. Evaluate for collection/hematoma. EXAM: CT NECK WITHOUT CONTRAST TECHNIQUE: Multidetector CT imaging of the neck was performed following the standard protocol without intravenous contrast.  COMPARISON:  Prior MRI from 05/22/2016. FINDINGS: Pharynx and larynx: Oral cavity within normal limits without mass lesion or loculated fluid collection. No acute abnormality about the dentition. Or pharyngeal soft tissues within normal limits. Nasopharynx normal. Epiglottis within normal limits. Vallecula clear. Streak artifact from ACDF extending from C3 through C7 mildly limits evaluation of the retropharyngeal/prevertebral soft tissues. 1.1 x 3.4 x 5.3 cm (AP by transverse by craniocaudad) hypodense collection seen within the retropharyngeal/ prevertebral soft tissues anterior to the ACDF (series 3, image 76). Craniocaudad dimension difficult to discern with certain mean given adjacent streak artifact. Collection is fairly hypodense in near fluid density without significant associated inflammatory changes, favoring postoperative seroma. Mild mass effect and bulging of the posterior margin of the hypopharynx anteriorly, although the airway remains widely patent. Postoperative stranding with mild swelling seen within the on the right neck along the surgical approach near the level of the thyroid cartilage (series 3, image 88). No definite discrete collection seen within this region. Slight leftward bowing of the adjacent trachea, although this is extremely minimal. Remainder of the hypopharynx and supraglottic larynx within normal limits. True cords symmetric and normal. Subglottic airway clear. Salivary glands: Salivary glands including the parotid and submandibular glands are within normal limits. 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 Lymph nodes: No appreciable adenopathy identified within the neck on this noncontrast examination. Vascular: Mild atheromatous plaque about the aortic arch and carotid bifurcations. Limited intracranial: Unremarkable. Visualized orbits: Visualized globes and orbital soft tissues within normal  limits. Mastoids and visualized paranasal sinuses: Visualized  paranasal sinuses are clear. Mastoids and middle ear cavities are clear. Skeleton: Status post ACDF at C3 through C7. Hardware appears well aligned without complication. No acute osseous abnormality. No worrisome lytic or blastic osseous lesions. Upper chest: Visualized upper chest within normal limits. Visualized lungs are clear. Emphysema noted. Other: None. IMPRESSION: 1. Postoperative changes from recent ACDF at C3 through C7. Approximate 1.1 x 3.4 x 5.3 cm collection within the adjacent prevertebral/retropharyngeal soft tissues, most consistent with a benign postoperative seroma. Postoperative soft tissue stranding with mild swelling within the adjacent right neck with fairly mild mass effect on the adjacent airway, which remains widely patent at this time. 2. No other acute abnormality within the neck. 3. Emphysema. Electronically Signed   By: Rise Mu M.D.   On: 12/06/2016 17:36   Dg Swallowing Func-speech Pathology  Result Date: 12/07/2016 Objective Swallowing Evaluation: Type of Study: MBS-Modified Barium Swallow Study Patient Details Name: Theresa Mahoney MRN: [id number] Date of Birth: 05/25/1960 Today's Date: 12/07/2016 Time: SLP Start Time (ACUTE ONLY): 1112-SLP Stop Time (ACUTE ONLY): 1129 SLP Time Calculation (min) (ACUTE ONLY): 17 min Past Medical History: Past Medical History: Diagnosis Date . Anxiety  . Arthritis  . COPD (chronic obstructive pulmonary disease) (HCC)  . Depression  . Headache  . Hypothyroidism  . Meniere's disease  Past Surgical History: Past Surgical History: Procedure Laterality Date . ABDOMINAL HYSTERECTOMY   . ANTERIOR CERVICAL DECOMP/DISCECTOMY FUSION N/A 11/30/2016  Procedure: ACDF  C3-C4 C4-C5 C5-C6 C6-C7;  Surgeon: Tia Alert, MD;  Location: [facility];  Service: Neurosurgery;  Laterality: N/A; . CESAREAN SECTION    1984, 1986 . FOOT SURGERY  2015, 2017  bone spurs . SHOULDER ARTHROSCOPY    RT 01/2016  . SHOULDER SURGERY Bilateral 2011  bone spurs HPI: 56  y.o.femaleseen regarding dysphagia after 4 level ACDF 11/30/16. PMH: Meniere's disease, hypothyroidism, COPD, anxiety. No Data Recorded Assessment / Plan / Recommendation CHL IP CLINICAL IMPRESSIONS 12/07/2016 Clinical Impression Pt exhibits mild-moderate reversible pharyngeal dysphagia. Noteable pharyngeal and cervical esophageal edema prevents full epiglottic deflection resulting in minimal penetration x 1 clearing vestibule with reflexive coughs and multiple swallows. Mild vallecular residue present, increased with nectar consistency and in pyriform sinuses; pt spontaneously performs multiple swallows and eventually can clear. Suboptimal pharyngeal contraction led to brief entrance of nectar into the nasopharynx. Intermittent hesitation at cervical due to decreased esophageal pressure. Recommend continue full liquid texture and upgrade to thin liquids, small sips, multiple swallows and throat clear/coughs. ST will follow for safety and efficiency and upgrade when able SLP Visit Diagnosis Dysphagia, pharyngoesophageal phase (R13.14) Attention and concentration deficit following -- Frontal lobe and executive function deficit following -- Impact on safety and function (No Data)   CHL IP TREATMENT RECOMMENDATION 12/07/2016 Treatment Recommendations Therapy as outlined in treatment plan below   Prognosis 12/07/2016 Prognosis for Safe Diet Advancement Good Barriers to Reach Goals -- Barriers/Prognosis Comment -- CHL IP DIET RECOMMENDATION 12/07/2016 SLP Diet Recommendations -- Liquid Administration via Cup;No straw Medication Administration Crushed with puree Compensations Multiple dry swallows after each bite/sip;Clear throat after each swallow;Small sips/bites Postural Changes Seated upright at 90 degrees   CHL IP OTHER RECOMMENDATIONS 12/07/2016 Recommended Consults -- Oral Care Recommendations Oral care BID Other Recommendations --   CHL IP FOLLOW UP RECOMMENDATIONS 12/07/2016 Follow up Recommendations None   CHL IP  FREQUENCY AND DURATION 12/07/2016 Speech Therapy Frequency (ACUTE ONLY) min 2x/week Treatment Duration 2 weeks  CHL IP ORAL PHASE 12/07/2016 Oral Phase WFL Oral - Pudding Teaspoon -- Oral - Pudding Cup -- Oral - Honey Teaspoon -- Oral - Honey Cup -- Oral - Nectar Teaspoon -- Oral - Nectar Cup -- Oral - Nectar Straw -- Oral - Thin Teaspoon -- Oral - Thin Cup -- Oral - Thin Straw -- Oral - Puree -- Oral - Mech Soft -- Oral - Regular -- Oral - Multi-Consistency -- Oral - Pill -- Oral Phase - Comment --  CHL IP PHARYNGEAL PHASE 12/07/2016 Pharyngeal Phase Impaired Pharyngeal- Pudding Teaspoon -- Pharyngeal -- Pharyngeal- Pudding Cup -- Pharyngeal -- Pharyngeal- Honey Teaspoon -- Pharyngeal -- Pharyngeal- Honey Cup -- Pharyngeal -- Pharyngeal- Nectar Teaspoon -- Pharyngeal -- Pharyngeal- Nectar Cup Pharyngeal residue - valleculae;Pharyngeal residue - pyriform;Reduced pharyngeal peristalsis;Reduced epiglottic inversion;Other (Comment);Nasopharyngeal reflux;Reduced laryngeal elevation Pharyngeal -- Pharyngeal- Nectar Straw -- Pharyngeal -- Pharyngeal- Thin Teaspoon -- Pharyngeal -- Pharyngeal- Thin Cup Reduced epiglottic inversion;Reduced airway/laryngeal closure;Penetration/Aspiration during swallow;Pharyngeal residue - valleculae;Reduced pharyngeal peristalsis Pharyngeal Material enters airway, remains ABOVE vocal cords and not ejected out Pharyngeal- Thin Straw -- Pharyngeal -- Pharyngeal- Puree -- Pharyngeal -- Pharyngeal- Mechanical Soft -- Pharyngeal -- Pharyngeal- Regular -- Pharyngeal -- Pharyngeal- Multi-consistency -- Pharyngeal -- Pharyngeal- Pill -- Pharyngeal -- Pharyngeal Comment --  CHL IP CERVICAL ESOPHAGEAL PHASE 12/07/2016 Cervical Esophageal Phase Impaired Pudding Teaspoon -- Pudding Cup -- Honey Teaspoon -- Honey Cup -- Nectar Teaspoon -- Nectar Cup -- Nectar Straw -- Thin Teaspoon -- Thin Cup -- Thin Straw -- Puree -- Mechanical Soft -- Regular -- Multi-consistency -- Pill -- Cervical Esophageal  Comment transient min residue No flowsheet data found. Theresa Mahoney 12/07/2016, 1:36 PM [name] [name].Ed CCC-SLP Pager [phone]               Assessment/Plan: Slowly getting better, continue current mgmt, CT reviewed - I really don;t think surgery will help as there is no large compressive collection   LOS: 3 days    Viktoriya Glaspy S 12/07/2016, 2:40 PM   '

## 2016-12-08 MED ORDER — DEXAMETHASONE SODIUM PHOSPHATE 4 MG/ML IJ SOLN
2.0000 mg | Freq: Four times a day (QID) | INTRAMUSCULAR | Status: DC
  Administered 2016-12-08 – 2016-12-10 (×6): 2 mg via INTRAVENOUS
  Filled 2016-12-08 (×5): qty 1

## 2016-12-08 MED ORDER — DEXAMETHASONE 2 MG PO TABS
2.0000 mg | ORAL_TABLET | Freq: Four times a day (QID) | ORAL | Status: DC
  Administered 2016-12-09 – 2016-12-10 (×4): 2 mg via ORAL
  Filled 2016-12-08 (×4): qty 1

## 2016-12-08 NOTE — Progress Notes (Addendum)
  Speech Language Pathology Treatment: Dysphagia  Patient Details Name: Theresa Mahoney MRN: [id number] DOB: 08-22-1960 Today's Date: 12/08/2016 Time: 4098-1191 SLP Time Calculation (min) (ACUTE ONLY): 28 min  Assessment / Plan / Recommendation Clinical Impression  Pt teary during session about thought of going home and her dysphagia. Evidently pt (per report) had been taking miniscule amounts of chicken soup majority of the time since yesterday afternoon although did report drinking less than an oz of coffee this am. Pt is understandably anxious about "choking/difficulty breathing" with po's. SLP reviewed the MBS and reassured pt that she will not asphyxiate (not eating solids) and that her body is compensating and attempting to protect her airway via the spontaneous/multiple swallows, coughs and throat clears. Advised her to take larger amounts of soups etc. ST will continue to follow.   HPI HPI: 56 y.o.femaleseen regarding dysphagia after 4 level ACDF 11/30/16. PMH: Meniere's disease, hypothyroidism, COPD, anxiety.      SLP Plan  Continue with current plan of care       Recommendations  Diet recommendations: Thin liquid (full liquids) Liquids provided via: Cup;No straw Medication Administration: Crushed with puree Supervision: Patient able to self feed Compensations: Slow rate;Small sips/bites;Multiple dry swallows after each bite/sip;Clear throat after each swallow Postural Changes and/or Swallow Maneuvers: Seated upright 90 degrees                Oral Care Recommendations: Oral care BID Follow up Recommendations: None SLP Visit Diagnosis: Dysphagia, pharyngoesophageal phase (R13.14) Plan: Continue with current plan of care       GO                Royce Macadamia 12/08/2016, 1:30 PM  [name] [name].Ed [employer] [phone]

## 2016-12-08 NOTE — Progress Notes (Signed)
Patient ID: Theresa Mahoney, female   DOB: 1961/03/24, 56 y.o.   MRN: [id number] Subjective: Patient reports she is overall fairly stable. Swallowing continues to improve ever so slowly. She is tolerating thin liquids but not thickened liquids or solids. Pain is tolerable.  Objective: Vital signs in last 24 hours: Temp:  [97.8 F (36.6 C)-98.8 F (37.1 C)] 98 F (36.7 C) (09/14 0510) Pulse Rate:  [50-66] 63 (09/14 0510) Resp:  [18] 18 (09/14 0510) BP: (127-147)/(68-83) 131/77 (09/14 0510) SpO2:  [98 %-100 %] 98 % (09/14 0510)  Intake/Output from previous day: 09/13 0701 - 09/14 0700 In: 2505 [P.O.:630; I.V.:1875] Out: -  Intake/Output this shift: Total I/O In: 240 [P.O.:240] Out: -   Her exam is stable. She has slightly improving proximal right upper extremity weakness that seems better than her preoperative status, otherwise her neurologic exam is normal, very mild swelling at the surgical site  Lab Results: Lab Results  Component Value Date   WBC 7.2 11/22/2016   HGB 15.1 (H) 11/22/2016   HCT 44.4 11/22/2016   MCV 89.5 11/22/2016   PLT 247 11/22/2016   Lab Results  Component Value Date   INR 0.86 11/22/2016   BMET Lab Results  Component Value Date   NA 140 11/22/2016   K 3.8 11/22/2016   CL 100 (L) 11/22/2016   CO2 32 11/22/2016   GLUCOSE 86 11/22/2016   BUN 7 11/22/2016   CREATININE 0.91 11/22/2016   CALCIUM 9.4 11/22/2016    Studies/Results: Dg Abd 1 View  Result Date: 12/06/2016 CLINICAL DATA:  Ileus. EXAM: ABDOMEN - 1 VIEW COMPARISON:  None. FINDINGS: The bowel gas pattern is normal. Mild degenerate changes are noted in the lumbar spine. There is no evidence for obstruction or free air. IMPRESSION: 1. Normal bowel gas pattern. 2. Degenerative changes of the lumbar spine. Electronically Signed   By: Marin Roberts M.D.   On: 12/06/2016 09:37   Ct Soft Tissue Neck Wo Contrast  Result Date: 12/06/2016 CLINICAL DATA:  Initial evaluation for  dysphagia of following for level ACDF, recent surgery. Evaluate for collection/hematoma. EXAM: CT NECK WITHOUT CONTRAST TECHNIQUE: Multidetector CT imaging of the neck was performed following the standard protocol without intravenous contrast. COMPARISON:  Prior MRI from 05/22/2016. FINDINGS: Pharynx and larynx: Oral cavity within normal limits without mass lesion or loculated fluid collection. No acute abnormality about the dentition. Or pharyngeal soft tissues within normal limits. Nasopharynx normal. Epiglottis within normal limits. Vallecula clear. Streak artifact from ACDF extending from C3 through C7 mildly limits evaluation of the retropharyngeal/prevertebral soft tissues. 1.1 x 3.4 x 5.3 cm (AP by transverse by craniocaudad) hypodense collection seen within the retropharyngeal/ prevertebral soft tissues anterior to the ACDF (series 3, image 76). Craniocaudad dimension difficult to discern with certain mean given adjacent streak artifact. Collection is fairly hypodense in near fluid density without significant associated inflammatory changes, favoring postoperative seroma. Mild mass effect and bulging of the posterior margin of the hypopharynx anteriorly, although the airway remains widely patent. Postoperative stranding with mild swelling seen within the on the right neck along the surgical approach near the level of the thyroid cartilage (series 3, image 88). No definite discrete collection seen within this region. Slight leftward bowing of the adjacent trachea, although this is extremely minimal. Remainder of the hypopharynx and supraglottic larynx within normal limits. True cords symmetric and normal. Subglottic airway clear. Salivary glands: Salivary glands including the parotid and submandibular glands are within normal limits. 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 Lymph nodes: No appreciable adenopathy identified within the neck on this noncontrast examination. Vascular: Mild  atheromatous plaque about the aortic arch and carotid bifurcations. Limited intracranial: Unremarkable. Visualized orbits: Visualized globes and orbital soft tissues within normal limits. Mastoids and visualized paranasal sinuses: Visualized paranasal sinuses are clear. Mastoids and middle ear cavities are clear. Skeleton: Status post ACDF at C3 through C7. Hardware appears well aligned without complication. No acute osseous abnormality. No worrisome lytic or blastic osseous lesions. Upper chest: Visualized upper chest within normal limits. Visualized lungs are clear. Emphysema noted. Other: None. IMPRESSION: 1. Postoperative changes from recent ACDF at C3 through C7. Approximate 1.1 x 3.4 x 5.3 cm collection within the adjacent prevertebral/retropharyngeal soft tissues, most consistent with a benign postoperative seroma. Postoperative soft tissue stranding with mild swelling within the adjacent right neck with fairly mild mass effect on the adjacent airway, which remains widely patent at this time. 2. No other acute abnormality within the neck. 3. Emphysema. Electronically Signed   By: Rise Mu M.D.   On: 12/06/2016 17:36   Dg Humerus Right  Result Date: 12/07/2016 CLINICAL DATA:  Patient fell at work in 2017 with resultant right humerus pain. EXAM: RIGHT HUMERUS - 2+ VIEW COMPARISON:  None. FINDINGS: Slight lateral downsloping of the acromion which narrows the impingement space for the rotator cuff and may cause pain with shoulder abduction. Inferomedial humeral head spurring at the glenohumeral joint consistent with osteoarthritis. No fracture or suspicious osseous lesions of the right humerus. The elbow joint appears intact. No soft tissue mass or mineralization. The included right ribs and lung are nonacute. IMPRESSION: 1. Slight lateral downsloping of the acromion which narrows the impingement space for the rotator cuff and may cause pain with shoulder abduction. 2. Inferomedial humeral head  spurring at the glenohumeral joint consistent with osteoarthritis. 3.  No acute fracture, malalignment nor bone destruction. Electronically Signed   By: Tollie Eth M.D.   On: 12/07/2016 16:05   Dg Swallowing Func-speech Pathology  Result Date: 12/07/2016 Objective Swallowing Evaluation: Type of Study: MBS-Modified Barium Swallow Study Patient Details Name: Angellee Cohill MRN: [id number] Date of Birth: Nov 16, 1960 Today's Date: 12/07/2016 Time: SLP Start Time (ACUTE ONLY): 1112-SLP Stop Time (ACUTE ONLY): 1129 SLP Time Calculation (min) (ACUTE ONLY): 17 min Past Medical History: Past Medical History: Diagnosis Date . Anxiety  . Arthritis  . COPD (chronic obstructive pulmonary disease) (HCC)  . Depression  . Headache  . Hypothyroidism  . Meniere's disease  Past Surgical History: Past Surgical History: Procedure Laterality Date . ABDOMINAL HYSTERECTOMY   . ANTERIOR CERVICAL DECOMP/DISCECTOMY FUSION N/A 11/30/2016  Procedure: ACDF  C3-C4 C4-C5 C5-C6 C6-C7;  Surgeon: Tia Alert, MD;  Location: [facility] OR;  Service: Neurosurgery;  Laterality: N/A; . CESAREAN SECTION    1984, 1986 . FOOT SURGERY  2015, 2017  bone spurs . SHOULDER ARTHROSCOPY    RT 01/2016  . SHOULDER SURGERY Bilateral 2011  bone spurs HPI: 56 y.o.femaleseen regarding dysphagia after 4 level ACDF 11/30/16. PMH: Meniere's disease, hypothyroidism, COPD, anxiety. No Data Recorded Assessment / Plan / Recommendation CHL IP CLINICAL IMPRESSIONS 12/07/2016 Clinical Impression Pt exhibits mild-moderate reversible pharyngeal dysphagia. Noteable pharyngeal and cervical esophageal edema prevents full epiglottic deflection resulting in minimal penetration x 1 clearing vestibule with reflexive coughs and multiple swallows. Mild vallecular residue present, increased with nectar consistency and in pyriform sinuses; pt spontaneously performs multiple swallows and eventually can clear. Suboptimal pharyngeal contraction led to brief entrance of nectar  into the  nasopharynx. Intermittent hesitation at cervical due to decreased esophageal pressure. Recommend continue full liquid texture and upgrade to thin liquids, small sips, multiple swallows and throat clear/coughs. ST will follow for safety and efficiency and upgrade when able SLP Visit Diagnosis Dysphagia, pharyngoesophageal phase (R13.14) Attention and concentration deficit following -- Frontal lobe and executive function deficit following -- Impact on safety and function (No Data)   CHL IP TREATMENT RECOMMENDATION 12/07/2016 Treatment Recommendations Therapy as outlined in treatment plan below   Prognosis 12/07/2016 Prognosis for Safe Diet Advancement Good Barriers to Reach Goals -- Barriers/Prognosis Comment -- CHL IP DIET RECOMMENDATION 12/07/2016 SLP Diet Recommendations -- Liquid Administration via Cup;No straw Medication Administration Crushed with puree Compensations Multiple dry swallows after each bite/sip;Clear throat after each swallow;Small sips/bites Postural Changes Seated upright at 90 degrees   CHL IP OTHER RECOMMENDATIONS 12/07/2016 Recommended Consults -- Oral Care Recommendations Oral care BID Other Recommendations --   CHL IP FOLLOW UP RECOMMENDATIONS 12/07/2016 Follow up Recommendations None   CHL IP FREQUENCY AND DURATION 12/07/2016 Speech Therapy Frequency (ACUTE ONLY) min 2x/week Treatment Duration 2 weeks      CHL IP ORAL PHASE 12/07/2016 Oral Phase WFL Oral - Pudding Teaspoon -- Oral - Pudding Cup -- Oral - Honey Teaspoon -- Oral - Honey Cup -- Oral - Nectar Teaspoon -- Oral - Nectar Cup -- Oral - Nectar Straw -- Oral - Thin Teaspoon -- Oral - Thin Cup -- Oral - Thin Straw -- Oral - Puree -- Oral - Mech Soft -- Oral - Regular -- Oral - Multi-Consistency -- Oral - Pill -- Oral Phase - Comment --  CHL IP PHARYNGEAL PHASE 12/07/2016 Pharyngeal Phase Impaired Pharyngeal- Pudding Teaspoon -- Pharyngeal -- Pharyngeal- Pudding Cup -- Pharyngeal -- Pharyngeal- Honey Teaspoon -- Pharyngeal -- Pharyngeal- Honey  Cup -- Pharyngeal -- Pharyngeal- Nectar Teaspoon -- Pharyngeal -- Pharyngeal- Nectar Cup Pharyngeal residue - valleculae;Pharyngeal residue - pyriform;Reduced pharyngeal peristalsis;Reduced epiglottic inversion;Other (Comment);Nasopharyngeal reflux;Reduced laryngeal elevation Pharyngeal -- Pharyngeal- Nectar Straw -- Pharyngeal -- Pharyngeal- Thin Teaspoon -- Pharyngeal -- Pharyngeal- Thin Cup Reduced epiglottic inversion;Reduced airway/laryngeal closure;Penetration/Aspiration during swallow;Pharyngeal residue - valleculae;Reduced pharyngeal peristalsis Pharyngeal Material enters airway, remains ABOVE vocal cords and not ejected out Pharyngeal- Thin Straw -- Pharyngeal -- Pharyngeal- Puree -- Pharyngeal -- Pharyngeal- Mechanical Soft -- Pharyngeal -- Pharyngeal- Regular -- Pharyngeal -- Pharyngeal- Multi-consistency -- Pharyngeal -- Pharyngeal- Pill -- Pharyngeal -- Pharyngeal Comment --  CHL IP CERVICAL ESOPHAGEAL PHASE 12/07/2016 Cervical Esophageal Phase Impaired Pudding Teaspoon -- Pudding Cup -- Honey Teaspoon -- Honey Cup -- Nectar Teaspoon -- Nectar Cup -- Nectar Straw -- Thin Teaspoon -- Thin Cup -- Thin Straw -- Puree -- Mechanical Soft -- Regular -- Multi-consistency -- Pill -- Cervical Esophageal Comment transient min residue No flowsheet data found. Royce Macadamia 12/07/2016, 1:36 PM [name] [name].Ed CCC-SLP Pager [phone]               Assessment/Plan: Patient still having trouble with dysphagia, though she seems to be swallowing liquids better. She feel she is still not ready to go home because she cannot tolerate swallowing thick liquids or her pills. I reduced her Decadron to 2 mg today. I still do not believe that surgical exploration is warranted and likely carries more risk than potential benefit, especially given lack of airway compromise   LOS: 4 days    Treg Diemer S 12/08/2016, 6:24 AM

## 2016-12-08 NOTE — Progress Notes (Signed)
Pt continue to c/o of difficulty swallowing therefore refusing any po medication. Explanation given to pt and encourage to try crushed medications with pudding or apple sauce but pt refused stating I can,t md made aware.

## 2016-12-09 NOTE — Progress Notes (Signed)
.  Pt able to take po medications crushed and mixed with coffee and creamer per pt request with small sips at a time. About 40 minutes spent with pt. Pt tolerated quiet well sitting in an upright position. No s/s of aspiration noted though pt and spouse were initally hesitance due to her difficulty swallowing .

## 2016-12-09 NOTE — Progress Notes (Signed)
Pt state feels  much better this am though throat sore but less difficulty swallowing. Cepacol spray offered prn. .. Po decadron given this am with thicken apple juice and coffee. Pt tolerated well,  now sitting up eating ice cream. Pt also ambulated about 650 ft  this am..  RN will continue to monitor.pt.

## 2016-12-09 NOTE — Progress Notes (Signed)
Patient seems to be doing better. Able to get some liquids down. Still struggling to get adequate nutrition. No new upper or lower extremity symptoms. No fever. Vital signs are stabe. Wound is healing well. Airway is midline.  Continue IV steroids and observation. Probable discharge home tomorrow.

## 2016-12-10 NOTE — Discharge Summary (Signed)
Physician Discharge Summary  Patient ID: Theresa Mahoney MRN: [id number] DOB/AGE: 57/21/1962 57 y.o.  Admit date: 12/04/2016 Discharge date: 12/10/2016  Admission Diagnoses:  Discharge Diagnoses:  Active Problems:   Dysphagia   Discharged Condition: good  Hospital Course: patient admitted to the hospital for observation after multilevel anterior cervical discectomy with significant dysphagia. Patient has been treated nonoperatively with steroids which is improved her symptoms greatly. Currently she is able to swallow medicines. She is tolerating a diet. She feels ready for discharge home.  Consults:   Significant Diagnostic Studies:   Treatments:   Discharge Exam: Blood pressure 104/72, pulse (!) 58, temperature 98.2 F (36.8 C), temperature source Oral, resp. rate 18, height  (1.702 m), weight 74.5 kg (164 lb 4.8 oz), SpO2 94 %.  Awake and alert. Oriented and appropriate. Cranial nerve function intact. Motor and sensory function of the extremities normal. Wound clean and dry. Chest and abdomen Neck soft. Airway midline. Disposition: 01-Home or Self Care   Allergies as of 12/10/2016      Reactions   Aspirin Rash   Severe rash   Nsaids Rash   Severe rash   Celebrex [celecoxib] Other (See Comments)   Abdominal pain     Ciprofloxacin Other (See Comments)   Depression - patient unaware of this.   Guaifenesin Palpitations   Decreased hearing    Erythromycin Tinitus   Oxycodone Itching, Rash      Medication List    TAKE these medications   atorvastatin 40 MG tablet Commonly known as:  LIPITOR Take 40 mg by mouth daily.   CAL-MAG-ZINC PO Take 2 tablets by mouth at bedtime.   cholecalciferol 1000 units tablet Commonly known as:  VITAMIN D Take 1,000 Units by mouth 2 (two) times daily.   diazepam 5 MG tablet Commonly known as:  VALIUM Take 2.5-5 mg by mouth every 8 (eight) hours as needed for anxiety.   DULoxetine 60 MG capsule Commonly known as:   CYMBALTA Take 60 mg by mouth 2 (two) times daily.   fluticasone 50 MCG/ACT nasal spray Commonly known as:  FLONASE Place 2 sprays into both nostrils daily.   gabapentin 100 MG capsule Commonly known as:  NEURONTIN Take 100 mg by mouth 2 (two) times daily.   GARLIC OIL PO Take 1 capsule by mouth 2 (two) times daily.   hydrochlorothiazide 50 MG tablet Commonly known as:  HYDRODIURIL Take 50 mg by mouth daily.   HYDROcodone-acetaminophen 7.5-325 MG tablet Commonly known as:  NORCO Take 1-2 tablets by mouth every 6 (six) hours as needed for moderate pain. What changed:  Another medication with the same name was changed. Make sure you understand how and when to take each.   HYDROcodone-acetaminophen 7.5-325 MG tablet Commonly known as:  NORCO Take 1-2 tablets by mouth every 4 (four) hours as needed for moderate pain. What changed:  how much to take   levothyroxine 75 MCG tablet Commonly known as:  SYNTHROID, LEVOTHROID Take 75 mcg by mouth daily before breakfast.   loratadine 10 MG tablet Commonly known as:  CLARITIN Take 10 mg by mouth every evening.   methocarbamol 500 MG tablet Commonly known as:  ROBAXIN Take 1 tablet (500 mg total) by mouth every 6 (six) hours as needed for muscle spasms.   potassium chloride SA 20 MEQ tablet Commonly known as:  K-DUR,KLOR-CON Take 20 mEq by mouth daily.   promethazine 12.5 MG tablet Commonly known as:  PHENERGAN Take 12.5 mg by mouth 2 (two)  times daily as needed for nausea or vomiting.   verapamil 240 MG CR tablet Commonly known as:  CALAN-SR Take 240 mg by mouth at bedtime.   vitamin A 16109 UNIT capsule Generic drug:  vitamin A Take 10,000 Units by mouth at bedtime.   vitamin C 1000 MG tablet Take 2,000 mg by mouth daily.        Signed: Ardie Dragoo A 12/10/2016, 11:15 AM

## 2016-12-10 NOTE — Progress Notes (Signed)
Pt ambulated in hall for 1000 ft with standby assist, no walker. Tolerated well. No PRN pain meds required for surgical pain. Surgical site dry, intact. Steri strips covered incision. Old, dry blood was marked by day shift RN on 9/15. No new drainage this shift. Pt ambulated to BR X 2 and voided clear, yellow urine, with no odor. No BM overnight. Pt tolerating thin liquids with crushed pills and icecream. Will continue to monitor.

## 2016-12-10 NOTE — Progress Notes (Signed)
Patient given discharge instructions with spouse at bedside.  All questions and concerns addressed.  Patient left unit by wheelchair accompanied by staff. Patient had all personal belongings.

## 2018-09-18 IMAGING — CR DG CHEST 2V
2 series · 2 of 2 positions shown · non-contrast
Comparison: None.

CLINICAL DATA: Preop evaluation for upcoming cervical surgery

EXAM:
CHEST  2 VIEW

[w chest pa]
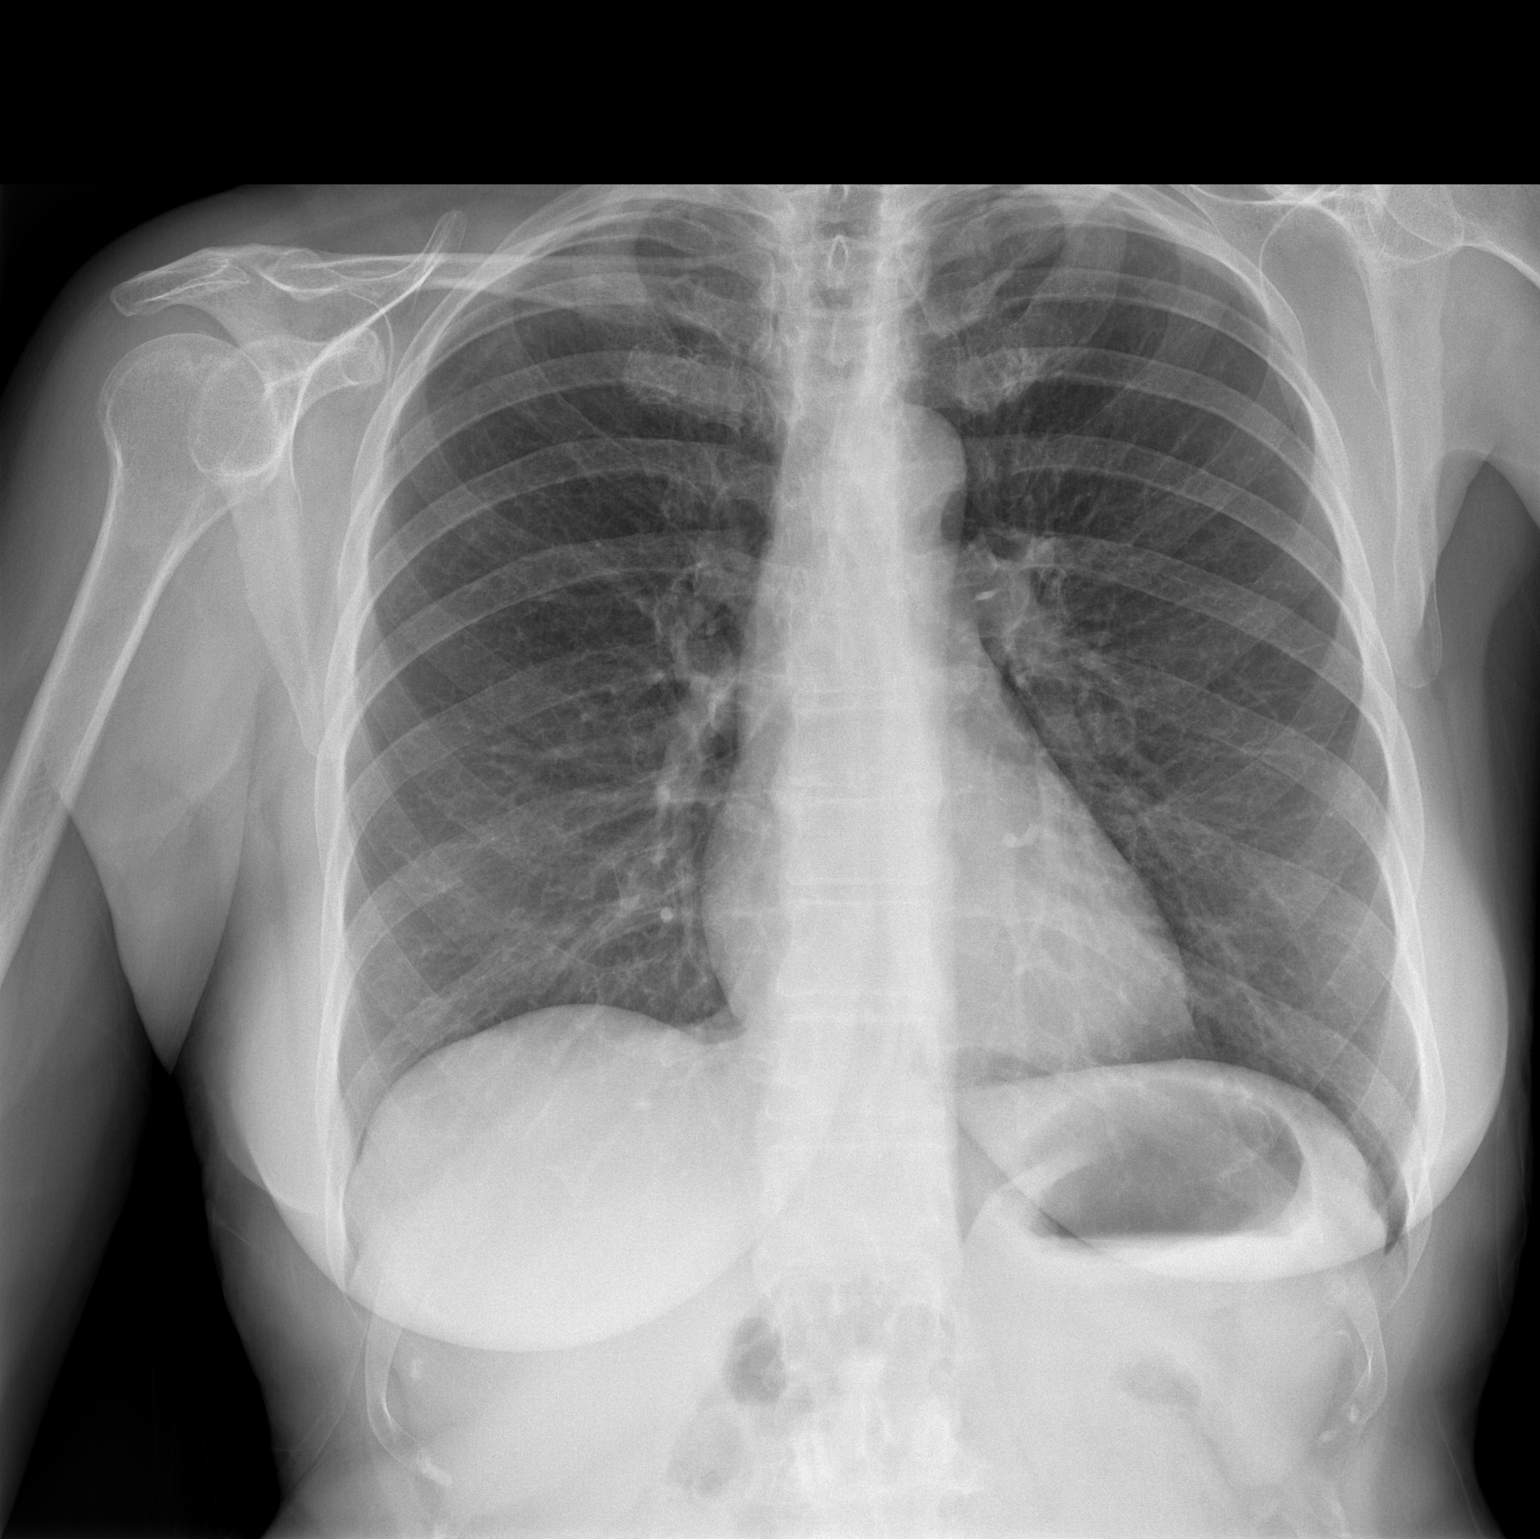

[w chest lat]
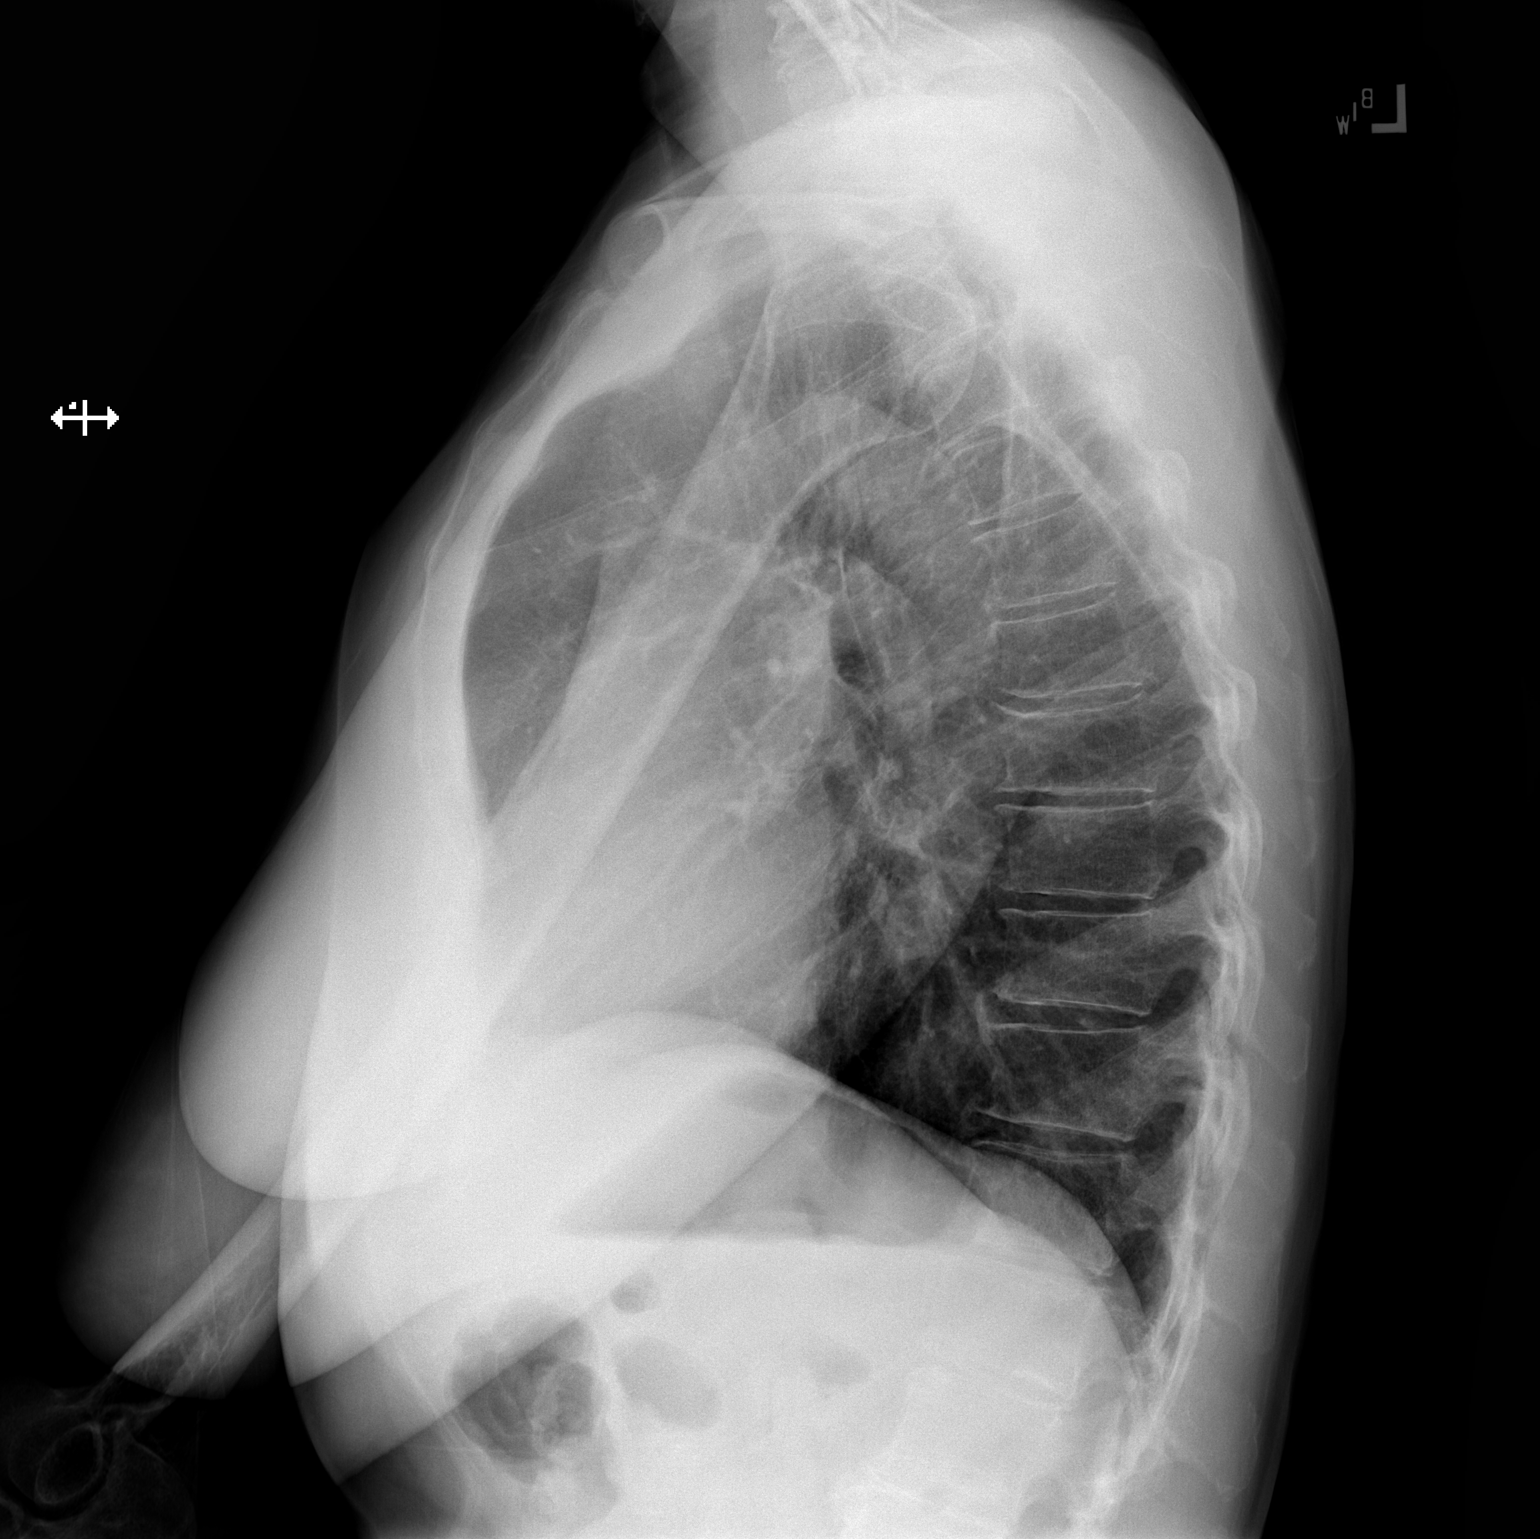

[2 of 2 positions shown; findings below may reference images not displayed]

FINDINGS: The heart size and mediastinal contours are within normal limits.
Both lungs are clear. The visualized skeletal structures are
unremarkable.
IMPRESSION: No active cardiopulmonary disease.

## 2018-11-29 IMAGING — RF DG CERVICAL SPINE 1V
1 series · 1 of 1 positions shown · non-contrast
Comparison: Cervical MRI 05/22/2016

CLINICAL DATA: ACDF C3 through C7

EXAM:
DG C-ARM 61-120 MIN; DG CERVICAL SPINE - 1 VIEW

[Series 1: run · 1 of 1 slices shown]
[im 1/1]
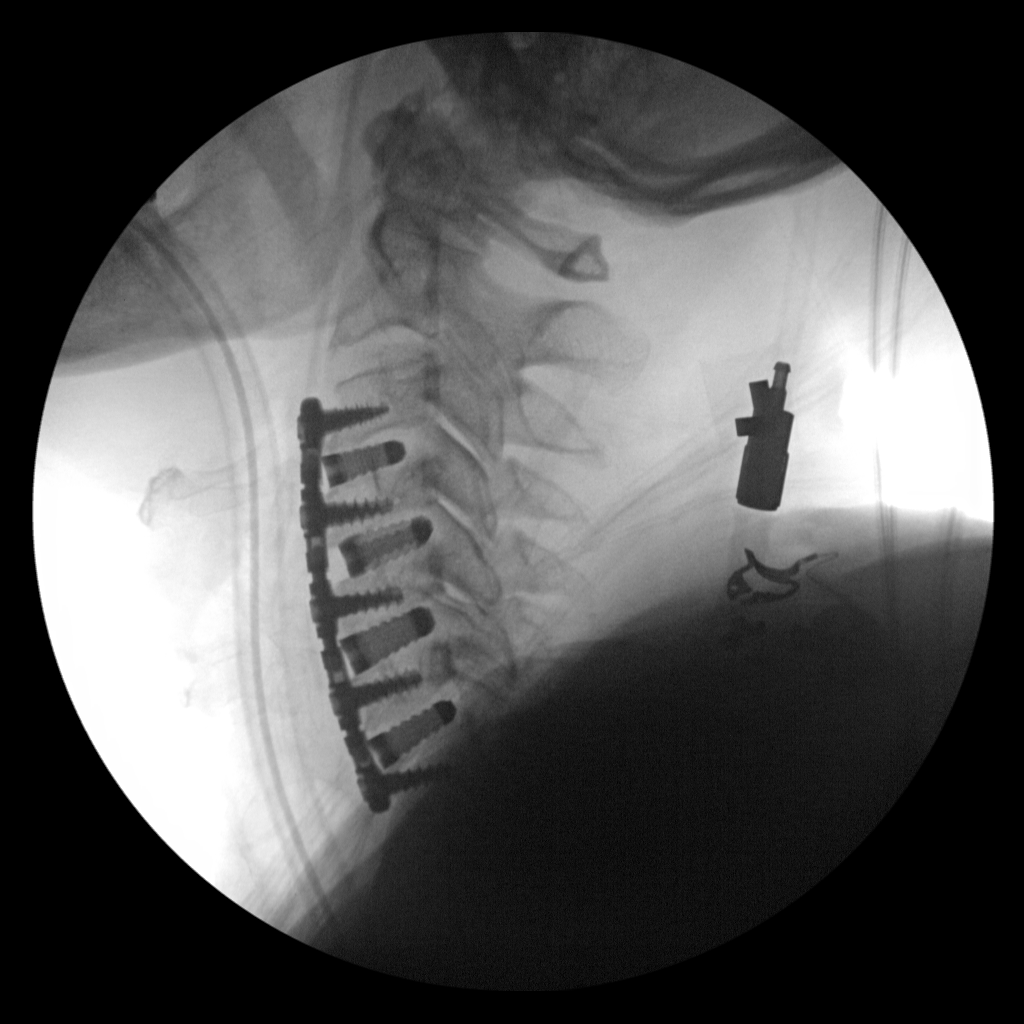

[1 of 1 positions shown; findings below may reference images not displayed]

FINDINGS: ACDF C3 through C7. Anterior plate and screws in good position.
Interbody metal spacers at C3-4, C4-5, C5-6, C6-7 in satisfactory
position.
IMPRESSION: ACDF C3 through C7.
